# Patient Record
Sex: Female | Born: 1965
Health system: Southern US, Community
[De-identification: ages and names within clinical notes are randomized; demographics above are authoritative.]

## PROBLEM LIST (undated history)

## (undated) DIAGNOSIS — I10 Essential (primary) hypertension: Secondary | ICD-10-CM

## (undated) DIAGNOSIS — O1490 Unspecified pre-eclampsia, unspecified trimester: Secondary | ICD-10-CM

## (undated) DIAGNOSIS — E119 Type 2 diabetes mellitus without complications: Secondary | ICD-10-CM

## (undated) DIAGNOSIS — K219 Gastro-esophageal reflux disease without esophagitis: Secondary | ICD-10-CM

## (undated) DIAGNOSIS — K5900 Constipation, unspecified: Secondary | ICD-10-CM

## (undated) HISTORY — DX: Unspecified pre-eclampsia, unspecified trimester: O14.90

## (undated) HISTORY — DX: Type 2 diabetes mellitus without complications: E11.9

## (undated) SURGERY — Surgical Case
Anesthesia: *Unknown

---

## 2001-02-22 ENCOUNTER — Emergency Department (HOSPITAL_COMMUNITY): Admission: EM | Admit: 2001-02-22 | Discharge: 2001-02-22 | Payer: Self-pay | Admitting: Emergency Medicine

## 2002-10-20 ENCOUNTER — Inpatient Hospital Stay (HOSPITAL_COMMUNITY): Admission: AD | Admit: 2002-10-20 | Discharge: 2002-10-20 | Payer: Self-pay | Admitting: *Deleted

## 2003-09-22 ENCOUNTER — Ambulatory Visit (HOSPITAL_COMMUNITY): Admission: RE | Admit: 2003-09-22 | Discharge: 2003-09-22 | Payer: Self-pay | Admitting: Obstetrics

## 2003-09-22 ENCOUNTER — Encounter: Payer: Self-pay | Admitting: Obstetrics

## 2004-04-26 ENCOUNTER — Emergency Department (HOSPITAL_COMMUNITY): Admission: EM | Admit: 2004-04-26 | Discharge: 2004-04-26 | Payer: Self-pay | Admitting: Emergency Medicine

## 2009-07-26 ENCOUNTER — Emergency Department (HOSPITAL_COMMUNITY): Admission: EM | Admit: 2009-07-26 | Discharge: 2009-07-26 | Payer: Self-pay | Admitting: Emergency Medicine

## 2011-01-01 ENCOUNTER — Encounter: Payer: Self-pay | Admitting: Obstetrics

## 2011-03-17 ENCOUNTER — Emergency Department (HOSPITAL_COMMUNITY)
Admission: EM | Admit: 2011-03-17 | Discharge: 2011-03-17 | Disposition: A | Discharge status: Home / Self Care | Payer: BC Managed Care – PPO | Attending: Emergency Medicine | Admitting: Emergency Medicine

## 2011-03-17 DIAGNOSIS — R0602 Shortness of breath: Secondary | ICD-10-CM | POA: Insufficient documentation

## 2011-03-17 DIAGNOSIS — R0789 Other chest pain: Secondary | ICD-10-CM | POA: Insufficient documentation

## 2011-03-17 DIAGNOSIS — R002 Palpitations: Secondary | ICD-10-CM | POA: Insufficient documentation

## 2011-03-17 DIAGNOSIS — E860 Dehydration: Secondary | ICD-10-CM | POA: Insufficient documentation

## 2011-03-17 DIAGNOSIS — R000 Tachycardia, unspecified: Secondary | ICD-10-CM | POA: Insufficient documentation

## 2011-03-17 LAB — DIFFERENTIAL
Basophils Absolute: 0 10*3/uL (ref 0.0–0.1)
Basophils Relative: 0 % (ref 0–1)
Eosinophils Absolute: 0.1 10*3/uL (ref 0.0–0.7)
Eosinophils Relative: 2 % (ref 0–5)
Lymphocytes Relative: 42 % (ref 12–46)
Lymphs Abs: 3.5 10*3/uL (ref 0.7–4.0)
Monocytes Absolute: 0.5 10*3/uL (ref 0.1–1.0)
Monocytes Relative: 6 % (ref 3–12)
Neutro Abs: 4.2 10*3/uL (ref 1.7–7.7)
Neutrophils Relative %: 50 % (ref 43–77)

## 2011-03-17 LAB — CBC
HCT: 40.1 % (ref 36.0–46.0)
Hemoglobin: 14.2 g/dL (ref 12.0–15.0)
WBC: 8.3 10*3/uL (ref 4.0–10.5)

## 2011-03-17 LAB — POCT CARDIAC MARKERS
CKMB, poc: <1 ng/mL — ABNORMAL LOW (ref 1.0–8.0)
Troponin i, poc: <0.05 ng/mL (ref 0.00–0.09)

## 2011-03-17 LAB — POCT I-STAT, CHEM 8
Chloride: 110 mEq/L (ref 96–112)
Creatinine, Ser: 0.8 mg/dL (ref 0.4–1.2)
Glucose, Bld: 89 mg/dL (ref 70–99)
HCT: 44 % (ref 36.0–46.0)
Potassium: 4.1 mEq/L (ref 3.5–5.1)

## 2011-03-17 LAB — ETHANOL: Alcohol, Ethyl (B): 48 mg/dL — ABNORMAL HIGH (ref 0–10)

## 2011-03-17 LAB — RAPID URINE DRUG SCREEN, HOSP PERFORMED
Amphetamines: NOT DETECTED
Cocaine: NOT DETECTED
Tetrahydrocannabinol: NOT DETECTED

## 2011-03-17 LAB — URINALYSIS, ROUTINE W REFLEX MICROSCOPIC
Bilirubin Urine: NEGATIVE
Nitrite: NEGATIVE
Protein, ur: NEGATIVE mg/dL
Specific Gravity, Urine: 1.014 (ref 1.005–1.030)
Urobilinogen, UA: 0.2 mg/dL (ref 0.0–1.0)

## 2011-03-17 LAB — URINE MICROSCOPIC-ADD ON

## 2011-03-18 LAB — URINALYSIS, ROUTINE W REFLEX MICROSCOPIC
Nitrite: NEGATIVE
Protein, ur: 30 mg/dL — AB
Specific Gravity, Urine: 1.03 (ref 1.005–1.030)
Urobilinogen, UA: 1 mg/dL (ref 0.0–1.0)

## 2011-03-18 LAB — DIFFERENTIAL
Basophils Absolute: 0.1 10*3/uL (ref 0.0–0.1)
Basophils Relative: 1 % (ref 0–1)
Lymphocytes Relative: 19 % (ref 12–46)
Monocytes Absolute: 0.5 10*3/uL (ref 0.1–1.0)
Neutro Abs: 7.2 10*3/uL (ref 1.7–7.7)
Neutrophils Relative %: 74 % (ref 43–77)

## 2011-03-18 LAB — URINE MICROSCOPIC-ADD ON

## 2011-03-18 LAB — CBC
Hemoglobin: 14.9 g/dL (ref 12.0–15.0)
Platelets: 300 10*3/uL (ref 150–400)
RDW: 14.3 % (ref 11.5–15.5)

## 2011-03-18 LAB — PREGNANCY, URINE: Preg Test, Ur: NEGATIVE

## 2011-09-13 ENCOUNTER — Other Ambulatory Visit: Payer: Self-pay | Admitting: Obstetrics

## 2011-09-13 DIAGNOSIS — Z1231 Encounter for screening mammogram for malignant neoplasm of breast: Secondary | ICD-10-CM

## 2011-10-10 ENCOUNTER — Ambulatory Visit (HOSPITAL_COMMUNITY): Payer: BC Managed Care – PPO | Attending: Obstetrics

## 2014-12-16 ENCOUNTER — Emergency Department (INDEPENDENT_AMBULATORY_CARE_PROVIDER_SITE_OTHER)
Admission: EM | Admit: 2014-12-16 | Discharge: 2014-12-16 | Disposition: A | Discharge status: Home / Self Care | Payer: BLUE CROSS/BLUE SHIELD | Source: Home / Self Care | Attending: Family Medicine | Admitting: Family Medicine

## 2014-12-16 ENCOUNTER — Encounter (HOSPITAL_COMMUNITY): Payer: Self-pay | Admitting: Emergency Medicine

## 2014-12-16 DIAGNOSIS — N390 Urinary tract infection, site not specified: Secondary | ICD-10-CM

## 2014-12-16 LAB — POCT URINALYSIS DIP (DEVICE)
GLUCOSE, UA: NEGATIVE mg/dL
Ketones, ur: NEGATIVE mg/dL
Nitrite: NEGATIVE
PH: 5.5 (ref 5.0–8.0)
Protein, ur: 30 mg/dL — AB
SPECIFIC GRAVITY, URINE: 1.025 (ref 1.005–1.030)
Urobilinogen, UA: 0.2 mg/dL (ref 0.0–1.0)

## 2014-12-16 MED ORDER — CEPHALEXIN 500 MG PO CAPS: 500.0000 mg | ORAL_CAPSULE | Freq: Two times a day (BID) | ORAL | Status: DC

## 2014-12-16 MED ORDER — FLUCONAZOLE 150 MG PO TABS: 150.0000 mg | ORAL_TABLET | Freq: Once | ORAL | Status: DC

## 2014-12-16 NOTE — ED Notes (Signed)
C/o UTI sx onset Tuesday Sx include: dysuria Denies fevers, chills, abd pain, hematuria Alert, no signs of acute distress.

## 2014-12-16 NOTE — Discharge Instructions (Signed)
Thank you for coming in today. If your belly pain worsens, or you have high fever, bad vomiting, blood in your stool or black tarry stool go to the Emergency Room.   Urinary Tract Infection Urinary tract infections (UTIs) can develop anywhere along your urinary tract. Your urinary tract is your body's drainage system for removing wastes and extra water. Your urinary tract includes two kidneys, two ureters, a bladder, and a urethra. Your kidneys are a pair of bean-shaped organs. Each kidney is about the size of your fist. They are located below your ribs, one on each side of your spine. CAUSES Infections are caused by microbes, which are microscopic organisms, including fungi, viruses, and bacteria. These organisms are so small that they can only be seen through a microscope. Bacteria are the microbes that most commonly cause UTIs. SYMPTOMS  Symptoms of UTIs may vary by age and gender of the patient and by the location of the infection. Symptoms in young women typically include a frequent and intense urge to urinate and a painful, burning feeling in the bladder or urethra during urination. Older women and men are more likely to be tired, shaky, and weak and have muscle aches and abdominal pain. A fever may mean the infection is in your kidneys. Other symptoms of a kidney infection include pain in your back or sides below the ribs, nausea, and vomiting. DIAGNOSIS To diagnose a UTI, your caregiver will ask you about your symptoms. Your caregiver also will ask to provide a urine sample. The urine sample will be tested for bacteria and white blood cells. White blood cells are made by your body to help fight infection. TREATMENT  Typically, UTIs can be treated with medication. Because most UTIs are caused by a bacterial infection, they usually can be treated with the use of antibiotics. The choice of antibiotic and length of treatment depend on your symptoms and the type of bacteria causing your  infection. HOME CARE INSTRUCTIONS  If you were prescribed antibiotics, take them exactly as your caregiver instructs you. Finish the medication even if you feel better after you have only taken some of the medication.  Drink enough water and fluids to keep your urine clear or pale yellow.  Avoid caffeine, tea, and carbonated beverages. They tend to irritate your bladder.  Empty your bladder often. Avoid holding urine for long periods of time.  Empty your bladder before and after sexual intercourse.  After a bowel movement, women should cleanse from front to back. Use each tissue only once. SEEK MEDICAL CARE IF:   You have back pain.  You develop a fever.  Your symptoms do not begin to resolve within 3 days. SEEK IMMEDIATE MEDICAL CARE IF:   You have severe back pain or lower abdominal pain.  You develop chills.  You have nausea or vomiting.  You have continued burning or discomfort with urination. MAKE SURE YOU:   Understand these instructions.  Will watch your condition.  Will get help right away if you are not doing well or get worse. Document Released: 09/06/2005 Document Revised: 05/28/2012 Document Reviewed: 01/05/2012 Exit[employer] Patient Information 2015 [employer]. This information is not intended to replace advice given to you by your health care provider. Make sure you discuss any questions you have with your health care provider.  

## 2014-12-16 NOTE — ED Provider Notes (Signed)
Janice Singleton is a 49 y.o. female who presents to Urgent Care today for urinary tract infection. Patient has a few days of pain with burning associated with mild frequency and urgency. No fevers or chills or abdominal pain. No hematuria. No medications tried yet.   History reviewed. No pertinent past medical history. History reviewed. No pertinent past surgical history. History  Substance Use Topics  . Smoking status: Current Every Day Smoker    Types: Cigarettes  . Smokeless tobacco: Not on file  . Alcohol Use: Yes   ROS as above Medications: No current facility-administered medications for this encounter.   Current Outpatient Prescriptions  Medication Sig Dispense Refill  . cephALEXin (KEFLEX) 500 MG capsule Take 1 capsule (500 mg total) by mouth 2 (two) times daily. 14 capsule 0  . fluconazole (DIFLUCAN) 150 MG tablet Take 1 tablet (150 mg total) by mouth once. 1 tablet 1   No Known Allergies   Exam:  BP 150/93 mmHg  Pulse 93  Temp(Src) 98 F (36.7 C) (Oral)  Resp 16  SpO2 97% Gen: Well NAD HEENT: EOMI,  MMM Lungs: Normal work of breathing. CTABL Heart: RRR no MRG Abd: NABS, Soft. Nondistended, Nontender Exts: Brisk capillary refill, warm and well perfused.   Results for orders placed or performed during the hospital encounter of 12/16/14 (from the past 24 hour(s))  POCT urinalysis dip (device)     Status: Abnormal   Collection Time: 12/16/14  9:54 AM  Result Value Ref Range   Glucose, UA NEGATIVE NEGATIVE mg/dL   Bilirubin Urine SMALL (A) NEGATIVE   Ketones, ur NEGATIVE NEGATIVE mg/dL   Specific Gravity, Urine 1.025 1.005 - 1.030   Hgb urine dipstick MODERATE (A) NEGATIVE   pH 5.5 5.0 - 8.0   Protein, ur 30 (A) NEGATIVE mg/dL   Urobilinogen, UA 0.2 0.0 - 1.0 mg/dL   Nitrite NEGATIVE NEGATIVE   Leukocytes, UA SMALL (A) NEGATIVE   No results found.  Assessment and Plan: 49 y.o. female with UTI. Culture pending treatment with Keflex  Discussed warning signs  or symptoms. Please see discharge instructions. Patient expresses understanding.     Rodolph BongEvan S Ladrea Holladay, MD 12/16/14 1010

## 2014-12-18 LAB — URINE CULTURE
Colony Count: >=100000
SPECIAL REQUESTS: NORMAL

## 2014-12-18 NOTE — ED Notes (Signed)
Urine culture: >100,000 colonies E. Coli.  Pt. adequately treated with Keflex. Vassie MoselleYork, Murlin Schrieber M 12/18/2014

## 2016-02-29 ENCOUNTER — Encounter (HOSPITAL_COMMUNITY): Payer: Self-pay | Admitting: *Deleted

## 2016-02-29 ENCOUNTER — Inpatient Hospital Stay (HOSPITAL_COMMUNITY)
Admission: AD | Admit: 2016-02-29 | Discharge: 2016-02-29 | Disposition: A | Discharge status: Home / Self Care | Payer: BLUE CROSS/BLUE SHIELD | Source: Ambulatory Visit | Attending: Obstetrics & Gynecology | Admitting: Obstetrics & Gynecology

## 2016-02-29 DIAGNOSIS — I10 Essential (primary) hypertension: Secondary | ICD-10-CM | POA: Diagnosis not present

## 2016-02-29 DIAGNOSIS — B9689 Other specified bacterial agents as the cause of diseases classified elsewhere: Secondary | ICD-10-CM

## 2016-02-29 DIAGNOSIS — F17210 Nicotine dependence, cigarettes, uncomplicated: Secondary | ICD-10-CM | POA: Insufficient documentation

## 2016-02-29 DIAGNOSIS — N760 Acute vaginitis: Secondary | ICD-10-CM | POA: Insufficient documentation

## 2016-02-29 DIAGNOSIS — A499 Bacterial infection, unspecified: Secondary | ICD-10-CM | POA: Diagnosis not present

## 2016-02-29 DIAGNOSIS — K5909 Other constipation: Secondary | ICD-10-CM | POA: Diagnosis not present

## 2016-02-29 DIAGNOSIS — K5900 Constipation, unspecified: Secondary | ICD-10-CM | POA: Diagnosis not present

## 2016-02-29 DIAGNOSIS — R109 Unspecified abdominal pain: Secondary | ICD-10-CM | POA: Insufficient documentation

## 2016-02-29 HISTORY — DX: Constipation, unspecified: K59.00

## 2016-02-29 HISTORY — DX: Gastro-esophageal reflux disease without esophagitis: K21.9

## 2016-02-29 LAB — URINALYSIS, ROUTINE W REFLEX MICROSCOPIC
Bilirubin Urine: NEGATIVE
GLUCOSE, UA: NEGATIVE mg/dL
Ketones, ur: NEGATIVE mg/dL
LEUKOCYTES UA: NEGATIVE
Nitrite: NEGATIVE
PH: 5.5 (ref 5.0–8.0)
PROTEIN: NEGATIVE mg/dL
SPECIFIC GRAVITY, URINE: 1.015 (ref 1.005–1.030)

## 2016-02-29 LAB — CBC
HEMATOCRIT: 40 % (ref 36.0–46.0)
Hemoglobin: 13.8 g/dL (ref 12.0–15.0)
MCH: 33.8 pg (ref 26.0–34.0)
MCHC: 34.5 g/dL (ref 30.0–36.0)
MCV: 98 fL (ref 78.0–100.0)
Platelets: 266 10*3/uL (ref 150–400)
RBC: 4.08 MIL/uL (ref 3.87–5.11)
RDW: 14.7 % (ref 11.5–15.5)
WBC: 9.3 10*3/uL (ref 4.0–10.5)

## 2016-02-29 LAB — WET PREP, GENITAL
Sperm: NONE SEEN
TRICH WET PREP: NONE SEEN
YEAST WET PREP: NONE SEEN

## 2016-02-29 LAB — URINE MICROSCOPIC-ADD ON
BACTERIA UA: NONE SEEN
WBC UA: NONE SEEN WBC/hpf (ref 0–5)

## 2016-02-29 LAB — POCT PREGNANCY, URINE: Preg Test, Ur: NEGATIVE

## 2016-02-29 MED ORDER — METRONIDAZOLE 0.75 % VA GEL: 1.0000 | Freq: Every day | VAGINAL | Status: DC

## 2016-02-29 MED ORDER — AMLODIPINE BESYLATE 5 MG PO TABS: 5.0000 mg | ORAL_TABLET | Freq: Every day | ORAL | Status: DC

## 2016-02-29 NOTE — Discharge Instructions (Signed)
Bacterial Vaginosis °Bacterial vaginosis is a vaginal infection that occurs when the normal balance of bacteria in the vagina is disrupted. It results from an overgrowth of certain bacteria. This is the most common vaginal infection in women of childbearing age. Treatment is important to prevent complications, especially in pregnant women, as it can cause a premature delivery. °CAUSES  °Bacterial vaginosis is caused by an increase in harmful bacteria that are normally present in smaller amounts in the vagina. Several different kinds of bacteria can cause bacterial vaginosis. However, the reason that the condition develops is not fully understood. °RISK FACTORS °Certain activities or behaviors can put you at an increased risk of developing bacterial vaginosis, including: °· Having a new sex partner or multiple sex partners. °· Douching. °· Using an intrauterine device (IUD) for contraception. °Women do not get bacterial vaginosis from toilet seats, bedding, swimming pools, or contact with objects around them. °SIGNS AND SYMPTOMS  °Some women with bacterial vaginosis have no signs or symptoms. Common symptoms include: °· Grey vaginal discharge. °· A fishlike odor with discharge, especially after sexual intercourse. °· Itching or burning of the vagina and vulva. °· Burning or pain with urination. °DIAGNOSIS  °Your health care provider will take a medical history and examine the vagina for signs of bacterial vaginosis. A sample of vaginal fluid may be taken. Your health care provider will look at this sample under a microscope to check for bacteria and abnormal cells. A vaginal pH test may also be done.  °TREATMENT  °Bacterial vaginosis may be treated with antibiotic medicines. These may be given in the form of a pill or a vaginal cream. A second round of antibiotics may be prescribed if the condition comes back after treatment. Because bacterial vaginosis increases your risk for sexually transmitted diseases, getting  treated can help reduce your risk for chlamydia, gonorrhea, HIV, and herpes. °HOME CARE INSTRUCTIONS  °· Only take over-the-counter or prescription medicines as directed by your health care provider. °· If antibiotic medicine was prescribed, take it as directed. Make sure you finish it even if you start to feel better. °· Tell all sexual partners that you have a vaginal infection. They should see their health care provider and be treated if they have problems, such as a mild rash or itching. °· During treatment, it is important that you follow these instructions: °· Avoid sexual activity or use condoms correctly. °· Do not douche. °· Avoid alcohol as directed by your health care provider. °· Avoid breastfeeding as directed by your health care provider. °SEEK MEDICAL CARE IF:  °· Your symptoms are not improving after 3 days of treatment. °· You have increased discharge or pain. °· You have a fever. °MAKE SURE YOU:  °· Understand these instructions. °· Will watch your condition. °· Will get help right away if you are not doing well or get worse. °FOR MORE INFORMATION  °Centers for Disease Control and Prevention, Division of STD Prevention: www.cdc.gov/std °American Sexual Health Association (ASHA): www.ashastd.org  °  °This information is not intended to replace advice given to you by your health care provider. Make sure you discuss any questions you have with your health care provider. °  °Document Released: 11/27/2005 Document Revised: 12/18/2014 Document Reviewed: 07/09/2013 °Elsevier Interactive Patient Education ©2016 [employer] °Hypertension °Hypertension, commonly called high blood pressure, is when the force of blood pumping through your arteries is too strong. Your arteries are the blood vessels that carry blood from your heart throughout your body. A blood   pressure reading consists of a higher number over a lower number, such as 110/72. The higher number (systolic) is the pressure inside your arteries  when your heart pumps. The lower number (diastolic) is the pressure inside your arteries when your heart relaxes. Ideally you want your blood pressure below 120/80. °Hypertension forces your heart to work harder to pump blood. Your arteries may become narrow or stiff. Having untreated or uncontrolled hypertension can cause heart attack, stroke, kidney disease, and other problems. °RISK FACTORS °Some risk factors for high blood pressure are controllable. Others are not.  °Risk factors you cannot control include:  °· Race. You may be at higher risk if you are African American. °· Age. Risk increases with age. °· Gender. Men are at higher risk than women before age 45 years. After age 65, women are at higher risk than men. °Risk factors you can control include: °· Not getting enough exercise or physical activity. °· Being overweight. °· Getting too much fat, sugar, calories, or salt in your diet. °· Drinking too much alcohol. °SIGNS AND SYMPTOMS °Hypertension does not usually cause signs or symptoms. Extremely high blood pressure (hypertensive crisis) may cause headache, anxiety, shortness of breath, and nosebleed. °DIAGNOSIS °To check if you have hypertension, your health care provider will measure your blood pressure while you are seated, with your arm held at the level of your heart. It should be measured at least twice using the same arm. Certain conditions can cause a difference in blood pressure between your right and left arms. A blood pressure reading that is higher than normal on one occasion does not mean that you need treatment. If it is not clear whether you have high blood pressure, you may be asked to return on a different day to have your blood pressure checked again. Or, you may be asked to monitor your blood pressure at home for 1 or more weeks. °TREATMENT °Treating high blood pressure includes making lifestyle changes and possibly taking medicine. Living a healthy lifestyle can help lower high blood  pressure. You may need to change some of your habits. °Lifestyle changes may include: °· Following the DASH diet. This diet is high in fruits, vegetables, and whole grains. It is low in salt, red meat, and added sugars. °· Keep your sodium intake below 2,300 mg per day. °· Getting at least 30-45 minutes of aerobic exercise at least 4 times per week. °· Losing weight if necessary. °· Not smoking. °· Limiting alcoholic beverages. °· Learning ways to reduce stress. °Your health care provider may prescribe medicine if lifestyle changes are not enough to get your blood pressure under control, and if one of the following is true: °· You are 18-59 years of age and your systolic blood pressure is above 140. °· You are 60 years of age or older, and your systolic blood pressure is above 150. °· Your diastolic blood pressure is above 90. °· You have diabetes, and your systolic blood pressure is over 140 or your diastolic blood pressure is over 90. °· You have kidney disease and your blood pressure is above 140/90. °· You have heart disease and your blood pressure is above 140/90. °Your personal target blood pressure may vary depending on your medical conditions, your age, and other factors. °HOME CARE INSTRUCTIONS °· Have your blood pressure rechecked as directed by your health care provider.   °· Take medicines only as directed by your health care provider. Follow the directions carefully. Blood pressure medicines must be taken as   prescribed. The medicine does not work as well when you skip doses. Skipping doses also puts you at risk for problems. °· Do not smoke.   °· Monitor your blood pressure at home as directed by your health care provider.  °SEEK MEDICAL CARE IF:  °· You think you are having a reaction to medicines taken. °· You have recurrent headaches or feel dizzy. °· You have swelling in your ankles. °· You have trouble with your vision. °SEEK IMMEDIATE MEDICAL CARE IF: °· You develop a severe headache or  confusion. °· You have unusual weakness, numbness, or feel faint. °· You have severe chest or abdominal pain. °· You vomit repeatedly. °· You have trouble breathing. °MAKE SURE YOU:  °· Understand these instructions. °· Will watch your condition. °· Will get help right away if you are not doing well or get worse. °  °This information is not intended to replace advice given to you by your health care provider. Make sure you discuss any questions you have with your health care provider. °  °Document Released: 11/27/2005 Document Revised: 04/13/2015 Document Reviewed: 09/19/2013 °Elsevier Interactive Patient Education ©2016 Elsevier Inc. ° °

## 2016-02-29 NOTE — MAU Note (Signed)
Pt C/O lower abd pain for the past 2 days, hurts to walk.  Has constipation, but had BM yesterday & today.  Denies bleeding or discharge.

## 2016-02-29 NOTE — MAU Provider Note (Signed)
History     CSN: [id number]  Arrival date and time: 02/29/16 [id number]   First Provider Initiated Contact with Patient 02/29/16 1103      Chief Complaint  Patient presents with  . Abdominal Pain   HPI   Ms.Theta Nicholaus BloomKelley is a 50 y.o. female G1P1001 presenting to MAU with abdominal pain. The pain started on Saturday; the pain is located in the bottom of her stomach on both sides. Initially she though it was due to constipation,however she had two bowel movements in the last 2 days. One was hard, and the second one was loose. She does not feel constipation any more.   Patient attests to drinking alcohol; not daily "I drink beer on the weekends".   She is sexually active with one partner X one year. No abnormal discharge.   Patient denies history of hypertension, she was told one time in the office that her BP was high.    OB History    Gravida Para Term Preterm AB TAB SAB Ectopic Multiple Living   1 1 1       1       Past Medical History  Diagnosis Date  . Acid reflux   . Constipation     Past Surgical History  Procedure Laterality Date  . Cesarean section      History reviewed. No pertinent family history.  Social History  Substance Use Topics  . Smoking status: Current Every Day Smoker -- 1.00 packs/day    Types: Cigarettes  . Smokeless tobacco: Never Used  . Alcohol Use: Yes     Comment: Socially    Allergies: No Known Allergies  Prescriptions prior to admission  Medication Sig Dispense Refill Last Dose  . cephALEXin (KEFLEX) 500 MG capsule Take 1 capsule (500 mg total) by mouth 2 (two) times daily. (Patient not taking: Reported on 02/29/2016) 14 capsule 0   . fluconazole (DIFLUCAN) 150 MG tablet Take 1 tablet (150 mg total) by mouth once. (Patient not taking: Reported on 02/29/2016) 1 tablet 1    Results for orders placed or performed during the hospital encounter of 02/29/16 (from the past 48 hour(s))  Urinalysis, Routine w reflex microscopic ([facility])      Status: Abnormal   Collection Time: 02/29/16  9:51 AM  Result Value Ref Range   Color, Urine YELLOW YELLOW   APPearance CLEAR CLEAR   Specific Gravity, Urine 1.015 1.005 - 1.030   pH 5.5 5.0 - 8.0   Glucose, UA NEGATIVE NEGATIVE mg/dL   Hgb urine dipstick SMALL (A) NEGATIVE   Bilirubin Urine NEGATIVE NEGATIVE   Ketones, ur NEGATIVE NEGATIVE mg/dL   Protein, ur NEGATIVE NEGATIVE mg/dL   Nitrite NEGATIVE NEGATIVE   Leukocytes, UA NEGATIVE NEGATIVE  Urine microscopic-add on     Status: Abnormal   Collection Time: 02/29/16  9:51 AM  Result Value Ref Range   Squamous Epithelial / LPF 0-5 (A) NONE SEEN   WBC, UA NONE SEEN 0 - 5 WBC/hpf   RBC / HPF 0-5 0 - 5 RBC/hpf   Bacteria, UA NONE SEEN NONE SEEN  Pregnancy, urine POC     Status: None   Collection Time: 02/29/16 10:13 AM  Result Value Ref Range   Preg Test, Ur NEGATIVE NEGATIVE    Comment:        THE SENSITIVITY OF THIS METHODOLOGY IS >24 mIU/mL   Wet prep, genital     Status: Abnormal   Collection Time: 02/29/16 11:29 AM  Result Value  Ref Range   Yeast Wet Prep HPF POC NONE SEEN NONE SEEN   Trich, Wet Prep NONE SEEN NONE SEEN   Clue Cells Wet Prep HPF POC PRESENT (A) NONE SEEN   WBC, Wet Prep HPF POC FEW (A) NONE SEEN    Comment: MODERATE BACTERIA SEEN   Sperm NONE SEEN   CBC     Status: None   Collection Time: 02/29/16 11:38 AM  Result Value Ref Range   WBC 9.3 4.0 - 10.5 K/uL   RBC 4.08 3.87 - 5.11 MIL/uL   Hemoglobin 13.8 12.0 - 15.0 g/dL   HCT 29.5 62.1 - 30.8 %   MCV 98.0 78.0 - 100.0 fL   MCH 33.8 26.0 - 34.0 pg   MCHC 34.5 30.0 - 36.0 g/dL   RDW 65.7 84.6 - 96.2 %   Platelets 266 150 - 400 K/uL    Review of Systems  Constitutional: Negative for fever and chills.  Gastrointestinal: Positive for abdominal pain and constipation. Negative for nausea, vomiting and diarrhea.  Genitourinary: Negative for dysuria.   Physical Exam   Blood pressure 155/85, pulse 88, temperature 98 F (36.7 C), temperature source  Oral, resp. rate 18, last menstrual period 01/26/2016.  Physical Exam  Constitutional: She is oriented to person, place, and time. She appears well-developed and well-nourished. No distress.  HENT:  Head: Normocephalic.  Eyes: Pupils are equal, round, and reactive to light.  Cardiovascular: Normal rate and normal heart sounds.   Respiratory: Effort normal.  GI: Soft. She exhibits no distension and no mass. There is no tenderness. There is no rebound and no guarding.  Genitourinary:  Speculum exam: Vagina - Small amount of thick, white discharge, mild odor Cervix - No contact bleeding Bimanual exam: Cervix closed, no CMT  Uterus non tender, normal size Adnexa non tender, no masses bilaterally GC/Chlam, wet prep done Chaperone present for exam.  Musculoskeletal: Normal range of motion.  Neurological: She is alert and oriented to person, place, and time.  Skin: Skin is warm. She is not diaphoretic.  Psychiatric: Her behavior is normal.    MAU Course  Procedures  None  MDM   Assessment and Plan   A:  1. Other constipation   2. BV (bacterial vaginosis)   3. Chronic hypertension     P:  Discharge home in stable condition RX: Norvasc 5 mg, metrogel Abdominal pain likely due to chronic constipation vs. BV Discussed at home remedies/ over the counter medications for constipation Patient to go to urgent care or [facility] if symptoms worsen.  Discussed the importance of PCP; patient given a list of local providers.     Duane Lope, NP 02/29/2016 2:29 PM

## 2016-03-01 LAB — GC/CHLAMYDIA PROBE AMP (~~LOC~~) NOT AT ARMC
Chlamydia: NEGATIVE
NEISSERIA GONORRHEA: NEGATIVE

## 2016-03-01 LAB — HIV ANTIBODY (ROUTINE TESTING W REFLEX): HIV SCREEN 4TH GENERATION: NONREACTIVE

## 2017-01-27 ENCOUNTER — Encounter (HOSPITAL_COMMUNITY): Payer: Self-pay | Admitting: Emergency Medicine

## 2017-01-27 ENCOUNTER — Emergency Department (HOSPITAL_COMMUNITY)
Admission: EM | Admit: 2017-01-27 | Discharge: 2017-01-27 | Disposition: A | Discharge status: Home / Self Care | Payer: BLUE CROSS/BLUE SHIELD | Attending: Emergency Medicine | Admitting: Emergency Medicine

## 2017-01-27 DIAGNOSIS — Z760 Encounter for issue of repeat prescription: Secondary | ICD-10-CM | POA: Insufficient documentation

## 2017-01-27 DIAGNOSIS — F17210 Nicotine dependence, cigarettes, uncomplicated: Secondary | ICD-10-CM | POA: Insufficient documentation

## 2017-01-27 DIAGNOSIS — Z79899 Other long term (current) drug therapy: Secondary | ICD-10-CM | POA: Insufficient documentation

## 2017-01-27 DIAGNOSIS — I10 Essential (primary) hypertension: Secondary | ICD-10-CM | POA: Insufficient documentation

## 2017-01-27 HISTORY — DX: Essential (primary) hypertension: I10

## 2017-01-27 LAB — I-STAT CHEM 8, ED
BUN: 6 mg/dL (ref 6–20)
CALCIUM ION: 1.28 mmol/L (ref 1.15–1.40)
CREATININE: 0.6 mg/dL (ref 0.44–1.00)
Chloride: 107 mmol/L (ref 101–111)
GLUCOSE: 73 mg/dL (ref 65–99)
HCT: 44 % (ref 36.0–46.0)
HEMOGLOBIN: 15 g/dL (ref 12.0–15.0)
Potassium: 4.2 mmol/L (ref 3.5–5.1)
Sodium: 143 mmol/L (ref 135–145)
TCO2: 27 mmol/L (ref 0–100)

## 2017-01-27 MED ORDER — AMLODIPINE BESYLATE 5 MG PO TABS: 5.0000 mg | ORAL_TABLET | Freq: Every day | ORAL | 0 refills | Status: DC

## 2017-01-27 MED ORDER — AMLODIPINE BESYLATE 5 MG PO TABS
5.0000 mg | ORAL_TABLET | Freq: Once | ORAL | Status: AC
  Administered 2017-01-27: 5 mg via ORAL
  Filled 2017-01-27: qty 1

## 2017-01-27 NOTE — ED Provider Notes (Signed)
[facility]-EMERGENCY DEPT Provider Note   CSN: [id number] Arrival date & time: 01/27/17  1022  By signing my name below, I, Soijett Blue, attest that this documentation has been prepared under the direction and in the presence of Trixie DredgeEmily Jadrien Narine, PA-C Electronically Signed: Soijett Blue, ED Scribe. 01/27/17. 11:07 AM.  History   Chief Complaint Chief Complaint  Patient presents with  . Hypertension  . Medication Refill    HPI Janice Singleton is a 51 y.o. female with a PMHx of HTN, who presents to the Emergency Department complaining of elevated blood pressure that she noticed 2-3 days ago. Pt drank vinegar without improvement.   Pt has had intermittent tingling in her left arm lasting seconds, that comes and goes lasting seconds.  She has not had this today, feels like her typical arthritis.  Pt notes that she hasn't taken her HTN medications for the past 6 months and reports that she used to take norvasc 5 mg for her HTN. Pt denies CP, SOB, HA, vision change, fever, chills, and any other symptoms. Pt denies having a PCP.   The history is provided by the patient. No language interpreter was used.    Past Medical History:  Diagnosis Date  . Acid reflux   . Constipation   . Hypertension     There are no active problems to display for this patient.   Past Surgical History:  Procedure Laterality Date  . CESAREAN SECTION      OB History    Gravida Para Term Preterm AB Living   1 1 1     1    SAB TAB Ectopic Multiple Live Births           1       Home Medications    Prior to Admission medications   Medication Sig Start Date End Date Taking? Authorizing Provider  amLODipine (NORVASC) 5 MG tablet Take 1 tablet (5 mg total) by mouth daily. 02/29/16   Harolyn RutherfordJennifer I Rasch, NP  amLODipine (NORVASC) 5 MG tablet Take 1 tablet (5 mg total) by mouth daily. 01/27/17   Trixie DredgeEmily Traci Gafford, PA-C  metroNIDAZOLE (METROGEL VAGINAL) 0.75 % vaginal gel Place 1 Applicatorful vaginally at bedtime. 02/29/16   Duane LopeJennifer I  Rasch, NP    Family History No family history on file.  Social History Social History  Substance Use Topics  . Smoking status: Current Every Day Smoker    Packs/day: 1.00    Types: Cigarettes  . Smokeless tobacco: Never Used  . Alcohol use Yes     Comment: Socially     Allergies   Patient has no known allergies.   Review of Systems Review of Systems  Constitutional: Negative for chills and fever.  Eyes: Negative for visual disturbance.  Respiratory: Negative for shortness of breath.   Cardiovascular: Negative for chest pain.  Neurological: Negative for headaches.       +Tingling to left arm     Physical Exam Updated Vital Signs BP 177/100 (BP Location: Left Arm)   Pulse 80   Temp 98.5 F (36.9 C) (Oral)   Resp 18   SpO2 97%   Physical Exam  Constitutional: She appears well-developed and well-nourished. No distress.  HENT:  Head: Normocephalic and atraumatic.  Neck: Neck supple.  Cardiovascular: Normal rate, regular rhythm and normal heart sounds.  Exam reveals no gallop and no friction rub.   No murmur heard. Pulmonary/Chest: Effort normal and breath sounds normal. No respiratory distress. She has no wheezes. She has no rales.  Neurological: She is alert.  Skin: She is not diaphoretic.  Nursing note and vitals reviewed.    ED Treatments / Results  DIAGNOSTIC STUDIES: Oxygen Saturation is 98% on RA, nl by my interpretation.    COORDINATION OF CARE: 11:04 AM Discussed treatment plan with pt at bedside which includes labs, and pt agreed to plan.   Labs (all labs ordered are listed, but only abnormal results are displayed) Labs Reviewed  I-STAT CHEM 8, ED    Procedures Procedures (including critical care time)  Medications Ordered in ED Medications  amLODipine (NORVASC) tablet 5 mg (5 mg Oral Given 01/27/17 1132)     Initial Impression / Assessment and Plan / ED Course  I have reviewed the triage vital signs and the nursing  notes.  Pertinent labs that were available during my care of the patient were reviewed by me and considered in my medical decision making (see chart for details).     Patient noted to be hypertensive in the emergency department. No signs of hypertensive urgency. Pt here for refill of HTN medication. Medication is not a controlled substance. Will refill medication here. Discussed with patient the need for close follow-up and management by their primary care physician. Pt is safe for discharge at this time. Discussed result, findings, treatment, and follow up  with patient.  Pt given return precautions.  Pt verbalizes understanding and agrees with plan.      Final Clinical Impressions(s) / ED Diagnoses   Final diagnoses:  Medication refill  Hypertension, unspecified type    New Prescriptions Discharge Medication List as of 01/27/2017 12:10 PM    START taking these medications   Details  !! amLODipine (NORVASC) 5 MG tablet Take 1 tablet (5 mg total) by mouth daily., Starting Sat 01/27/2017, Print     !! - Potential duplicate medications found. Please discuss with provider.     I personally performed the services described in this documentation, which was scribed in my presence. The recorded information has been reviewed and is accurate.     Trixie Dredge, PA-C 01/27/17 1455    Gwyneth Sprout, MD 01/27/17 1626

## 2017-01-27 NOTE — Discharge Instructions (Signed)
Read the information below.  Use the prescribed medication as directed.  Please discuss all new medications with your pharmacist.  You may return to the Emergency Department at any time for worsening condition or any new symptoms that concern you.  Please check you blood pressure within 1 week.  Follow up with your primary care provider for continued management of you blood pressure.

## 2017-01-27 NOTE — ED Triage Notes (Signed)
Per pt, states she has not taking her Norvasc in over a year-states her BP has been high-asymptomatic

## 2018-02-06 ENCOUNTER — Emergency Department (HOSPITAL_COMMUNITY): Payer: BLUE CROSS/BLUE SHIELD

## 2018-02-06 ENCOUNTER — Emergency Department (HOSPITAL_COMMUNITY)
Admission: EM | Admit: 2018-02-06 | Discharge: 2018-02-06 | Disposition: A | Discharge status: Home / Self Care | Payer: BLUE CROSS/BLUE SHIELD | Attending: Emergency Medicine | Admitting: Emergency Medicine

## 2018-02-06 ENCOUNTER — Encounter (HOSPITAL_COMMUNITY): Payer: Self-pay | Admitting: Emergency Medicine

## 2018-02-06 DIAGNOSIS — Z79899 Other long term (current) drug therapy: Secondary | ICD-10-CM | POA: Insufficient documentation

## 2018-02-06 DIAGNOSIS — M79641 Pain in right hand: Secondary | ICD-10-CM | POA: Diagnosis not present

## 2018-02-06 DIAGNOSIS — R102 Pelvic and perineal pain: Secondary | ICD-10-CM

## 2018-02-06 DIAGNOSIS — R52 Pain, unspecified: Secondary | ICD-10-CM

## 2018-02-06 DIAGNOSIS — K769 Liver disease, unspecified: Secondary | ICD-10-CM | POA: Diagnosis not present

## 2018-02-06 DIAGNOSIS — F17210 Nicotine dependence, cigarettes, uncomplicated: Secondary | ICD-10-CM | POA: Insufficient documentation

## 2018-02-06 DIAGNOSIS — N858 Other specified noninflammatory disorders of uterus: Secondary | ICD-10-CM | POA: Diagnosis not present

## 2018-02-06 DIAGNOSIS — I10 Essential (primary) hypertension: Secondary | ICD-10-CM | POA: Diagnosis not present

## 2018-02-06 DIAGNOSIS — R350 Frequency of micturition: Secondary | ICD-10-CM | POA: Diagnosis not present

## 2018-02-06 LAB — CBC WITH DIFFERENTIAL/PLATELET
Basophils Absolute: 0 10*3/uL (ref 0.0–0.1)
Basophils Relative: 0 %
Eosinophils Absolute: 0.2 10*3/uL (ref 0.0–0.7)
Eosinophils Relative: 2 %
HCT: 41.1 % (ref 36.0–46.0)
Hemoglobin: 14.1 g/dL (ref 12.0–15.0)
Lymphocytes Relative: 34 %
Lymphs Abs: 3 10*3/uL (ref 0.7–4.0)
MCH: 33.3 pg (ref 26.0–34.0)
MCHC: 34.3 g/dL (ref 30.0–36.0)
MCV: 96.9 fL (ref 78.0–100.0)
Monocytes Absolute: 0.3 10*3/uL (ref 0.1–1.0)
Monocytes Relative: 4 %
Neutro Abs: 5.3 10*3/uL (ref 1.7–7.7)
Neutrophils Relative %: 60 %
Platelets: 245 10*3/uL (ref 150–400)
RBC: 4.24 MIL/uL (ref 3.87–5.11)
RDW: 15.2 % (ref 11.5–15.5)
WBC: 8.8 10*3/uL (ref 4.0–10.5)

## 2018-02-06 LAB — URINALYSIS, ROUTINE W REFLEX MICROSCOPIC
Bilirubin Urine: NEGATIVE
Glucose, UA: NEGATIVE mg/dL
Hgb urine dipstick: NEGATIVE
Ketones, ur: NEGATIVE mg/dL
Leukocytes, UA: NEGATIVE
Nitrite: NEGATIVE
Protein, ur: NEGATIVE mg/dL
Specific Gravity, Urine: 1.009 (ref 1.005–1.030)
pH: 7 (ref 5.0–8.0)

## 2018-02-06 LAB — WET PREP, GENITAL
Sperm: NONE SEEN
TRICH WET PREP: NONE SEEN
Yeast Wet Prep HPF POC: NONE SEEN

## 2018-02-06 LAB — BASIC METABOLIC PANEL
Anion gap: 10 (ref 5–15)
BUN: 7 mg/dL (ref 6–20)
CO2: 25 mmol/L (ref 22–32)
Calcium: 9 mg/dL (ref 8.9–10.3)
Chloride: 106 mmol/L (ref 101–111)
Creatinine, Ser: 0.58 mg/dL (ref 0.44–1.00)
GFR calc Af Amer: >60 mL/min (ref >60)
GFR calc non Af Amer: >60 mL/min (ref >60)
Glucose, Bld: 165 mg/dL — ABNORMAL HIGH (ref 65–99)
Potassium: 3.5 mmol/L (ref 3.5–5.1)
Sodium: 141 mmol/L (ref 135–145)

## 2018-02-06 LAB — POC URINE PREG, ED: Preg Test, Ur: NEGATIVE

## 2018-02-06 MED ORDER — KETOROLAC TROMETHAMINE 60 MG/2ML IM SOLN
30.0000 mg | Freq: Once | INTRAMUSCULAR | Status: DC
  Filled 2018-02-06: qty 2

## 2018-02-06 MED ORDER — ACETAMINOPHEN 500 MG PO TABS: 500.0000 mg | ORAL_TABLET | Freq: Four times a day (QID) | ORAL | 0 refills | Status: DC | PRN

## 2018-02-06 MED ORDER — KETOROLAC TROMETHAMINE 30 MG/ML IJ SOLN
30.0000 mg | Freq: Once | INTRAMUSCULAR | Status: AC
  Administered 2018-02-06: 30 mg via INTRAVENOUS

## 2018-02-06 MED ORDER — DICYCLOMINE HCL 10 MG/ML IM SOLN
20.0000 mg | Freq: Once | INTRAMUSCULAR | Status: AC
  Administered 2018-02-06: 20 mg via INTRAMUSCULAR
  Filled 2018-02-06: qty 2

## 2018-02-06 MED ORDER — AMLODIPINE BESYLATE 5 MG PO TABS: 5.0000 mg | ORAL_TABLET | Freq: Every day | ORAL | 0 refills | Status: DC

## 2018-02-06 MED ORDER — IOPAMIDOL (ISOVUE-300) INJECTION 61%
INTRAVENOUS | Status: AC
  Administered 2018-02-06: 80 mL
  Filled 2018-02-06: qty 100

## 2018-02-06 MED ORDER — IBUPROFEN 800 MG PO TABS: 800.0000 mg | ORAL_TABLET | Freq: Three times a day (TID) | ORAL | 0 refills | Status: DC

## 2018-02-06 NOTE — ED Provider Notes (Signed)
Signout from [employer] Edison[employer]ole Pisiciotta, PA-C at shift change See previous provider note for full H&P  Briefly, patient has had lower abdominal pain radiating to her back for the past 4 days.  She reports it alternates from right to left side.  Labs are unremarkable. Pelvic exam was conducted and found no significant findings. At time of signout, CT abdomen pelvis is pending.    CT abdomen pelvis shows: IMPRESSION: 1. There is a mass arising from the leftward aspect of the uterine fundus measuring 6.7 x 4.0 x 3.9 cm. This mass shows irregular enhancement. While this mass potentially may represent a leiomyoma. This enhancement pattern does raise concern for possible uterine neoplasm. Note that there is also fullness in the region of the cervix. A well-defined cervical mass is not seen by CT. These findings do warrant gynecologic consultation including visual assessment of the cervix.  2. Enhancing lesion in the right lobe of the liver. Question slightly atypical hemangioma. A liver metastasis could present in this manner. It may be prudent in this regard to consider nonemergent MR or CT pre and serial post-contrast of the liver for further assessment.  3. Urinary bladder wall thickening felt to represent a degree of cystitis. No renal or ureteral calculus. No hydronephrosis.  4. No bowel obstruction. No abscess. Appendix appears normal.  5. No demonstrable adenopathy. No evident omental lesions.  6. Sclerotic area in the L3 vertebral body. This sclerotic focus may represent a prominent bone island. A sclerotic neoplastic lesion cannot be excluded, however.  7. Aortoiliac atherosclerosis.  I consulted OB/GYN who advised close follow-up.  Dr. Alvester MorinNewton, OB/GYN, states that their office will contact the patient, the patient also given follow-up information.  Patient also advised to follow-up and establish care with PCP for further evaluation of liver lesion.  Patient given several  resources.  Will discharge home with ibuprofen, Tylenol, and also refill of Norvasc.  Patient is well-appearing with vitals stable and discharged in satisfactory condition.    Emi HolesLaw, Janice Porto M, PA-C 02/06/18 1648    Tilden Fossaees, Elizabeth, MD 02/07/18 [phone]

## 2018-02-06 NOTE — ED Provider Notes (Signed)
[facility] Provider Note   CSN: [id number] Arrival date & time: 02/06/18  1043     History   Chief Complaint Chief Complaint  Patient presents with  . Pelvic Pain     HPI   Blood pressure (!) 145/89, pulse 80, temperature 98.2 F (36.8 C), temperature source Oral, resp. rate 17, last menstrual period 01/26/2016, SpO2 98 %.  Janice Singleton is a 52 y.o. female complaining of lower abdominal pain radiating to the back onset 4 days ago with no associated fevers, chills, nausea, vomiting, dysuria, hematuria or abnormal vaginal discharge.  She is been taking ibuprofen at home with little relief, she also took laxatives with little relief.  She notes a pain to her right hand (triage note states that the swelling but this is not accurate) and bilateral plantar foot pain when she walks.  Is been noncompliant with her hypertensive medications for several months.  Past Medical History:  Diagnosis Date  . Acid reflux   . Constipation   . Hypertension     There are no active problems to display for this patient.   Past Surgical History:  Procedure Laterality Date  . CESAREAN SECTION      OB History    Gravida Para Term Preterm AB Living   1 1 1     1    SAB TAB Ectopic Multiple Live Births           1       Home Medications    Prior to Admission medications   Medication Sig Start Date End Date Taking? Authorizing Provider  amLODipine (NORVASC) 5 MG tablet Take 1 tablet (5 mg total) by mouth daily. 02/29/16   Rasch, Victorino DikeJennifer I, NP  amLODipine (NORVASC) 5 MG tablet Take 1 tablet (5 mg total) by mouth daily. 01/27/17   Trixie DredgeWest, Emily, PA-C  amLODipine (NORVASC) 5 MG tablet Take 1 tablet (5 mg total) by mouth daily. 02/06/18   Saunders [place], Joni ReiningNicole, PA-C  metroNIDAZOLE (METROGEL VAGINAL) 0.75 % vaginal gel Place 1 Applicatorful vaginally at bedtime. 02/29/16   Rasch, Harolyn RutherfordJennifer I, NP    Family History History reviewed. No pertinent family  history.  Social History Social History   Tobacco Use  . Smoking status: Current Every Day Smoker    Packs/day: 1.00    Types: Cigarettes  . Smokeless tobacco: Never Used  Substance Use Topics  . Alcohol use: Yes    Comment: Socially  . Drug use: No     Allergies   Patient has no known allergies.   Review of Systems Review of Systems  A complete review of systems was obtained and all systems are negative except as noted in the HPI and PMH.   Physical Exam Updated Vital Signs BP (!) 145/89   Pulse 80   Temp 98.2 F (36.8 C) (Oral)   Resp 17   LMP 01/26/2016 (Approximate)   SpO2 98%   Physical Exam  Constitutional: She is oriented to person, place, and time. She appears well-developed and well-nourished. No distress.  HENT:  Head: Normocephalic and atraumatic.  Mouth/Throat: Oropharynx is clear and moist.  Eyes: Conjunctivae and EOM are normal. Pupils are equal, round, and reactive to light.  Neck: Normal range of motion.  Cardiovascular: Normal rate, regular rhythm and intact distal pulses.  Pulmonary/Chest: Effort normal and breath sounds normal.  Abdominal: Soft. There is no tenderness.  Genitourinary:  Genitourinary Comments: Pelvic exam is chaperoned by nurse: No rashes or lesions, no significant  vaginal discharge, no adnexal or cervical motion tenderness  Musculoskeletal: Normal range of motion.  Neurological: She is alert and oriented to person, place, and time.  Skin: She is not diaphoretic.  Psychiatric: She has a normal mood and affect.  Nursing note and vitals reviewed.    ED Treatments / Results  Labs (all labs ordered are listed, but only abnormal results are displayed) Labs Reviewed  WET PREP, GENITAL - Abnormal; Notable for the following components:      Result Value   Clue Cells Wet Prep HPF POC PRESENT (*)    WBC, Wet Prep HPF POC FEW (*)    All other components within normal limits  URINALYSIS, ROUTINE W REFLEX MICROSCOPIC - Abnormal;  Notable for the following components:   APPearance HAZY (*)    All other components within normal limits  BASIC METABOLIC PANEL - Abnormal; Notable for the following components:   Glucose, Bld 165 (*)    All other components within normal limits  CBC WITH DIFFERENTIAL/PLATELET  POC URINE PREG, ED  GC/CHLAMYDIA PROBE AMP ([place]) NOT AT [facility]    EKG  EKG Interpretation None       Radiology No results found.  Procedures Procedures (including critical care time)  Medications Ordered in ED Medications  iopamidol (ISOVUE-300) 61 % injection (not administered)  dicyclomine (BENTYL) injection 20 mg (20 mg Intramuscular Given 02/06/18 1512)  ketorolac (TORADOL) 30 MG/ML injection 30 mg (30 mg Intravenous Given 02/06/18 1511)     Initial Impression / Assessment and Plan / ED Course  I have reviewed the triage vital signs and the nursing notes.  Pertinent labs & imaging results that were available during my care of the patient were reviewed by me and considered in my medical decision making (see chart for details).     Vitals:   02/06/18 1055 02/06/18 1415 02/06/18 1500  BP: (!) 178/100 139/79 (!) 145/89  Pulse: 80 75 80  Resp: 17    Temp: 98.2 F (36.8 C)    TempSrc: Oral    SpO2: 98% 99% 98%    Medications  iopamidol (ISOVUE-300) 61 % injection (not administered)  dicyclomine (BENTYL) injection 20 mg (20 mg Intramuscular Given 02/06/18 1512)  ketorolac (TORADOL) 30 MG/ML injection 30 mg (30 mg Intravenous Given 02/06/18 1511)    Janice Singleton is 52 y.o. female presenting with lower abdominal pain no other associated symptoms.  She reporting pain in her right hand and bilateral feet.  Patient is neurovascularly intact with no focal bony tenderness, this does not appear to affect the joints.  Blood pressure today is elevated, she states she has been noncompliant with her hypertensive medications.  It does not appear that insurance is an issue, she states that she will  start retaking her medications if I refill them.  Pelvic exam unremarkable.  Wet prep with clue cells however patient is not complaining of vaginal discharge, no indication to treat bacterial vaginosis in this scenario.  Case signed out to [place] law at shift change: Plan to follow up CT   Final Clinical Impressions(s) / ED Diagnoses   Final diagnoses:  Pain    ED Discharge Orders        Ordered    amLODipine (NORVASC) 5 MG tablet  Daily     02/06/18 1525       Janice Singleton, Janice Singleton 02/06/18 1531    Tilden Fossa, MD 02/07/18 [phone]

## 2018-02-06 NOTE — Discharge Instructions (Addendum)
For pain control please take ibuprofen (also known as Motrin or Advil) 800mg  (this is normally 4 over the counter pills) 3 times a day  for 5 days. Take with food to minimize stomach irritation.  Take acetaminophen (Tylenol) up to 975 mg (this is normally 3 over-the-counter pills) up to 3 times a day. Do not drink alcohol. Make sure your other medications do not contain acetaminophen (Read the labels!)  Do not hesitate to return to the emergency room for any new, worsening or concerning symptoms.  Please follow-up with OB/GYN as outlined below for further evaluation of the mass found on your uterus today.  Please call their office, but they will also try to contact you to schedule appointment.  Please obtain primary care using resource guide below. Let them know that you were seen in the emergency room and that they will need to obtain records for further outpatient management.  It is recommended that you get further imaging (through primary care provider) to assess the abnormal finding on your liver (possible hemangioma), in addition to following up with OB/GYN if OB/GYN does not assess this for you.  You can call the number circled on your discharge paperwork, outlined in black, to help you find a primary care provider.

## 2018-02-06 NOTE — ED Triage Notes (Signed)
Patien presents to ED for assessment of fullness feeling in her lower mid abdomen, with increased urinary frequency and intermittent pain to her lower back.  Patient also c/o swelling to her right hand which has been going on for a week, and also pain in both plantar aspects of her feet with ambulation.

## 2018-02-07 LAB — GC/CHLAMYDIA PROBE AMP (~~LOC~~) NOT AT ARMC
Chlamydia: NEGATIVE
Neisseria Gonorrhea: NEGATIVE

## 2018-02-13 ENCOUNTER — Telehealth: Payer: Self-pay | Admitting: Family Medicine

## 2018-02-13 NOTE — Telephone Encounter (Signed)
Called and spoke to pt to remind them of their apt tomorrow. Advised of building number and time restrictions. °

## 2018-02-14 ENCOUNTER — Ambulatory Visit: Payer: BLUE CROSS/BLUE SHIELD | Admitting: Family Medicine

## 2018-02-14 ENCOUNTER — Other Ambulatory Visit: Payer: Self-pay

## 2018-02-14 ENCOUNTER — Telehealth: Payer: Self-pay | Admitting: Family Medicine

## 2018-02-14 ENCOUNTER — Encounter: Payer: Self-pay | Admitting: Family Medicine

## 2018-02-14 VITALS — BP 174/98 | HR 69 | Temp 98.8°F | Ht 63.78 in | Wt 100.6 lb

## 2018-02-14 DIAGNOSIS — I10 Essential (primary) hypertension: Secondary | ICD-10-CM | POA: Diagnosis not present

## 2018-02-14 DIAGNOSIS — R932 Abnormal findings on diagnostic imaging of liver and biliary tract: Secondary | ICD-10-CM

## 2018-02-14 DIAGNOSIS — R109 Unspecified abdominal pain: Secondary | ICD-10-CM | POA: Insufficient documentation

## 2018-02-14 DIAGNOSIS — N859 Noninflammatory disorder of uterus, unspecified: Secondary | ICD-10-CM | POA: Diagnosis not present

## 2018-02-14 DIAGNOSIS — R1030 Lower abdominal pain, unspecified: Secondary | ICD-10-CM | POA: Diagnosis not present

## 2018-02-14 DIAGNOSIS — R21 Rash and other nonspecific skin eruption: Secondary | ICD-10-CM | POA: Diagnosis not present

## 2018-02-14 DIAGNOSIS — Z1231 Encounter for screening mammogram for malignant neoplasm of breast: Secondary | ICD-10-CM | POA: Diagnosis not present

## 2018-02-14 DIAGNOSIS — B9689 Other specified bacterial agents as the cause of diseases classified elsewhere: Secondary | ICD-10-CM | POA: Diagnosis not present

## 2018-02-14 DIAGNOSIS — N760 Acute vaginitis: Secondary | ICD-10-CM

## 2018-02-14 DIAGNOSIS — R739 Hyperglycemia, unspecified: Secondary | ICD-10-CM

## 2018-02-14 DIAGNOSIS — N858 Other specified noninflammatory disorders of uterus: Secondary | ICD-10-CM | POA: Insufficient documentation

## 2018-02-14 DIAGNOSIS — R102 Pelvic and perineal pain: Secondary | ICD-10-CM | POA: Insufficient documentation

## 2018-02-14 MED ORDER — LISINOPRIL 10 MG PO TABS
10.0000 mg | ORAL_TABLET | Freq: Every day | ORAL | 3 refills | Status: DC
Start: 2018-02-14 — End: 2018-02-28

## 2018-02-14 MED ORDER — TRAMADOL HCL 50 MG PO TABS: 50.0000 mg | ORAL_TABLET | Freq: Three times a day (TID) | ORAL | 0 refills | Status: DC | PRN

## 2018-02-14 MED ORDER — TRIAMCINOLONE ACETONIDE 0.1 % EX CREA: 1.0000 "application " | TOPICAL_CREAM | Freq: Two times a day (BID) | CUTANEOUS | 0 refills | Status: DC

## 2018-02-14 MED ORDER — METRONIDAZOLE 500 MG PO TABS: 500.0000 mg | ORAL_TABLET | Freq: Two times a day (BID) | ORAL | 0 refills | Status: DC

## 2018-02-14 MED ORDER — AMLODIPINE BESYLATE 10 MG PO TABS: 10.0000 mg | ORAL_TABLET | Freq: Every day | ORAL | 3 refills | Status: DC

## 2018-02-14 NOTE — Progress Notes (Deleted)
   3/7/201911:08 AM  Larence Penning May 08, 1966, 52 y.o. female [id number]  Chief Complaint  Patient presents with  . Establish Care    Releeased from ER 2/27. Mass found on uterus, futher imaging is needed to assess the abnormal findings on liver    HPI:   Patient is a 52 y.o. female with past medical history significant for *** who presents today for ***  Depression screen PHQ 2/9 02/14/2018  Decreased Interest 0  Down, Depressed, Hopeless 0  PHQ - 2 Score 0    Allergies  Allergen Reactions  . Other Other (See Comments)    ALL antibiotics cause yeast infections    Prior to Admission medications   Medication Sig Start Date End Date Taking? Authorizing Provider  amLODipine (NORVASC) 5 MG tablet Take 1 tablet (5 mg total) by mouth daily. 02/29/16  Yes Rasch, Victorino Dike I, NP  ibuprofen (ADVIL,MOTRIN) 800 MG tablet Take 1 tablet (800 mg total) by mouth 3 (three) times daily. 02/06/18  Yes Law, Waylan Boga, PA-C  acetaminophen (TYLENOL) 500 MG tablet Take 1 tablet (500 mg total) by mouth every 6 (six) hours as needed. Patient not taking: Reported on 02/14/2018 02/06/18   Emi Holes, PA-C  amLODipine (NORVASC) 5 MG tablet Take 1 tablet (5 mg total) by mouth daily. Patient not taking: Reported on 02/06/2018 01/27/17   Trixie Dredge, PA-C  amLODipine (NORVASC) 5 MG tablet Take 1 tablet (5 mg total) by mouth daily. Patient not taking: Reported on 02/14/2018 02/06/18   Pisciotta, Joni Reining, PA-C  metroNIDAZOLE (METROGEL VAGINAL) 0.75 % vaginal gel Place 1 Applicatorful vaginally at bedtime. Patient not taking: Reported on 02/06/2018 02/29/16   Rasch, Harolyn Rutherford, NP    Past Medical History:  Diagnosis Date  . Acid reflux   . Constipation   . Hypertension     Past Surgical History:  Procedure Laterality Date  . CESAREAN SECTION      Social History   Tobacco Use  . Smoking status: Current Every Day Smoker    Packs/day: 1.00    Types: Cigarettes  . Smokeless tobacco: Never Used    Substance Use Topics  . Alcohol use: Yes    Comment: Socially    Family History  Problem Relation Age of Onset  . Alcohol abuse Mother   . Hypertension Father   . Hypertension Sister   . Hypertension Brother   . Healthy Daughter     ROS   OBJECTIVE:  Blood pressure (!) 174/98, pulse 69, temperature 98.8 F (37.1 C), temperature source Oral, height 5' 3.78" (1.62 m), weight 100 lb 9.6 oz (45.6 kg), last menstrual period 01/26/2016, SpO2 98 %.  Physical Exam  No results found for this or any previous visit (from the past 24 hour(s)).  No results found.   ASSESSMENT and PLAN  ***  No Follow-up on file.    Myles Lipps, MD Primary Care at [facility] [address] [place], [place] [address] Ph.  [phone] Fax [phone]

## 2018-02-14 NOTE — Telephone Encounter (Signed)
Copied from CRM [id number]. Topic: Quick Communication - See Telephone Encounter >> Feb 14, 2018  3:08 PM Oneal GroutSebastian, Jennifer S wrote: CRM for notification. See Telephone encounter for:  With Antibiotic being called in, patient  requesting diflucan called into [facility] on [address] 02/14/18.

## 2018-02-14 NOTE — Patient Instructions (Signed)
     IF you received an x-ray today, you will receive an invoice from [facility]. Please contact [facility] at [phone] with questions or concerns regarding your invoice.   IF you received labwork today, you will receive an invoice from [facility]. Please contact [facility] at [phone] with questions or concerns regarding your invoice.   Our billing staff will not be able to assist you with questions regarding bills from these companies.  You will be contacted with the lab results as soon as they are available. The fastest way to get your results is to activate your My Chart account. Instructions are located on the last page of this paperwork. If you have not heard from us regarding the results in 2 weeks, please contact this office.     

## 2018-02-14 NOTE — Progress Notes (Signed)
Subjective:    Patient ID: Janice Singleton, female    DOB: 1966-07-27, 52 y.o.   MRN: [id number]  Chief Complaint  Patient presents with  . Establish Care    Released from ER 2/27. Mass found on uterus. Futher imaging is needed to assess the abnormal findings on liver. Requesting referral for mammagram and wanting cologuard.   HPI   Patient presents today for follow-up to ER on 02/06/2018 for lower abdominal pain that was thought to be related to uterine mass found on CT scan. Patient missed three days from work due to abdominal pain. Patient has been taking ibuprofen 800 mg and acetaminophen prn as needed for pain, but it hasn't helped. Patient states that pain is a 7/10 today.   Workup in ER also revealed BV, patient not prescribed flagyl Patient is requesting referral to Gyn for further eval of uterine mass  There was an incidental finding of enhancing lesion in the right lobe of the liver. Question slightly atypical hemangioma. A liver metastasis could present in this manner. Patient did not have LFTs done.   Patient has not had routine medical care in years. HCM is not uptodate.  She is due for mammogram, colon cancer screening and pap She has never been tested for HCV She had a negative HIV done during most recent ER visit  Patient reports having h/o HTN. She is currently taking Amlodipine PO daily for hypertension. Patient states she takes it as directed. Her blood pressure today was 174/98 mm Hg.  Allergies  Allergen Reactions  . Other Other (See Comments)    ALL antibiotics cause yeast infections   Prior to Admission medications   Medication Sig Start Date End Date Taking? Authorizing Provider  amLODipine (NORVASC) 5 MG tablet Take 1 tablet (5 mg total) by mouth daily. 02/06/18  Yes   ibuprofen (ADVIL,MOTRIN) 800 MG tablet Take 1 tablet (800 mg total) by mouth 3 (three) times daily. 02/06/18  Yes Law, Waylan Boga, PA-C   Past Medical History:  Diagnosis Date  . Acid reflux    . Constipation   . Diabetes mellitus without complication (HCC)    Gestational   . Hypertension   . Pre-eclampsia    Social History   Tobacco Use  . Smoking status: Current Every Day Smoker    Packs/day: 2.00    Types: Cigarettes  . Smokeless tobacco: Never Used  Substance Use Topics  . Alcohol use: Yes    Alcohol/week: 12.6 oz    Types: 21 Cans of beer per week    Comment: 3 beers a day  . Drug use: No   Family History  Problem Relation Age of Onset  . Alcohol abuse Mother   . Hypertension Father   . Hypertension Sister   . Hypertension Brother   . Healthy Daughter    Past Surgical History:  Procedure Laterality Date  . CESAREAN SECTION      Review of Systems  Constitutional: Positive for fatigue. Negative for appetite change, chills, diaphoresis, fever and unexpected weight change.  HENT: Negative.  Negative for congestion, ear pain, rhinorrhea, sinus pressure and sinus pain.   Eyes: Negative.  Negative for pain and visual disturbance.  Respiratory: Positive for cough. Negative for chest tightness and shortness of breath.   Cardiovascular: Negative.  Negative for chest pain and palpitations.  Gastrointestinal: Positive for abdominal pain and constipation ("Last time I pooped was Monday."). Negative for blood in stool, diarrhea, nausea, rectal pain and vomiting.  Endocrine: Positive for  polydipsia and polyuria.  Genitourinary: Positive for frequency. Negative for difficulty urinating, dysuria and urgency.  Musculoskeletal: Negative for arthralgias and myalgias.  Skin: Positive for rash.  Allergic/Immunologic: Negative for environmental allergies and food allergies.  Neurological: Positive for numbness (Occasional numbness and burning in hands and feet.). Negative for dizziness and headaches.  Hematological: Negative for adenopathy. Does not bruise/bleed easily.  Psychiatric/Behavioral: Negative.  Negative for dysphoric mood and sleep disturbance. The patient is not  nervous/anxious.        Objective:   Physical Exam  Constitutional: She is oriented to person, place, and time. She appears well-developed. No distress.  Patient is underweight.   BP (!) 174/98 (BP Location: Left Arm, Patient Position: Sitting, Cuff Size: Normal)   Pulse 69   Temp 98.8 F (37.1 C) (Oral)   Ht 5' 3.78" (1.62 m)   Wt 100 lb 9.6 oz (45.6 kg)   LMP 01/26/2016 (Approximate)   SpO2 98%   BMI 17.39 kg/m   HENT:  Head: Normocephalic and atraumatic.  Right Ear: External ear normal.  Left Ear: External ear normal.  Nose: Nose normal.  Mouth/Throat: Oropharynx is clear and moist.  Eyes: Conjunctivae and EOM are normal. Pupils are equal, round, and reactive to light.  Neck: Normal range of motion. Neck supple.  Cardiovascular: Normal rate, regular rhythm, normal heart sounds and intact distal pulses.  Pulmonary/Chest: Effort normal. She has wheezes.  Scattered expiratory wheezes.   Abdominal: Soft. Bowel sounds are normal. There is tenderness (lower abdomen). There is no rebound and no guarding.  Musculoskeletal: Normal range of motion.  Neurological: She is alert and oriented to person, place, and time. She has normal reflexes.  Skin: Skin is warm and dry. Rash noted. She is not diaphoretic.  Patches of hyperpigmentation that began on left arm and have spread to rest of body. Patches vary in size but are all less than 1 cm in diameter, are non-flaky, and are in a diffuse pattern. Patches are itchy.  Psychiatric: She has a normal mood and affect. Her behavior is normal.      Assessment & Plan:   1. Uterine mass A uterine mass was identified on CT on 02/06/2018.  Patient was referred to OB/GYN for further evaluation and management.  - Ambulatory referral to Obstetrics / Gynecology  2. Abnormal findings on diagnostic imaging of liver Patient with incidental finding on CT scan, referring to GI for further evaluation and management - Ambulatory referral to  Gastroenterology  3. Lower abdominal pain Presumed to be secondary to uterine mass. Pain not well controlled in Ibuprofen 800mg  TID, adding a limited prescription for Tramadol 50 mg every 8 hours prn. Discussed new med r/se/b.  4. Bacterial vaginosis On patient's ED visit on 02/06/2018, clue cells were present on wet prep. Flagyl was prescribed for management of BV.  5. Essential hypertension, benign Patient has a history of uncontrolled hypertension. In office today her blood pressure was 174/98 mm Hg. Patient is currently taking Amlodipine 5 mg PO daily for hypertension. Patient's amlodipine dose was increased to 10 mg PO daily. Lisinopril 10 mg PO daily was added to patient's medication regime.   6. Visit for screening mammogram Patient is due for a mammogram to screen for breast cancer. Order was placed for mammogram. - MM DIGITAL SCREENING BILATERAL; Future  7. Elevated serum glucose On 02/06/2018, patient's glucose on BMP was 165 mg/dL. Patient has a history of gestational diabetes. Additionally, patient reports polydipsia, polyuria, and numbness in her hands and  feet. A hemoglobin A1c was done.  - Hemoglobin A1c  8. Rash and nonspecific skin eruption Dry skin with mild eczema. Recommended mixing Vaseline into Nivea (patient's usual skin care routine,) to help with skin dryness. Tropical triamconoline was prescribed for rash.  Patient was seen with PA-C student. Patient independently interviewed and examined by me.  Return in about 2 weeks (around 02/28/2018).

## 2018-02-14 NOTE — Telephone Encounter (Signed)
Called patient and spoke with patient directly regarding prescription for Flagyl. Patient was advised to take Flagyl 500 mg BID for seven days for her Bacterial Vaginosis. Patient was advised to NOT DRINK ALCOHOL while on Flagyl.

## 2018-02-15 ENCOUNTER — Encounter: Payer: Self-pay | Admitting: Family Medicine

## 2018-02-15 LAB — HEMOGLOBIN A1C
Est. average glucose Bld gHb Est-mCnc: 114 mg/dL
Hgb A1c MFr Bld: 5.6 % (ref 4.8–5.6)

## 2018-02-28 ENCOUNTER — Ambulatory Visit: Payer: BLUE CROSS/BLUE SHIELD | Admitting: Family Medicine

## 2018-02-28 ENCOUNTER — Other Ambulatory Visit: Payer: Self-pay | Admitting: Family Medicine

## 2018-02-28 ENCOUNTER — Other Ambulatory Visit: Payer: Self-pay

## 2018-02-28 ENCOUNTER — Encounter: Payer: Self-pay | Admitting: Family Medicine

## 2018-02-28 VITALS — BP 146/86 | HR 79 | Temp 98.0°F | Ht 61.0 in | Wt 102.4 lb

## 2018-02-28 DIAGNOSIS — I10 Essential (primary) hypertension: Secondary | ICD-10-CM | POA: Diagnosis not present

## 2018-02-28 DIAGNOSIS — R932 Abnormal findings on diagnostic imaging of liver and biliary tract: Secondary | ICD-10-CM

## 2018-02-28 DIAGNOSIS — R0981 Nasal congestion: Secondary | ICD-10-CM | POA: Diagnosis not present

## 2018-02-28 DIAGNOSIS — F17200 Nicotine dependence, unspecified, uncomplicated: Secondary | ICD-10-CM | POA: Diagnosis not present

## 2018-02-28 DIAGNOSIS — R1030 Lower abdominal pain, unspecified: Secondary | ICD-10-CM

## 2018-02-28 MED ORDER — LISINOPRIL 20 MG PO TABS: 20.0000 mg | ORAL_TABLET | Freq: Every day | ORAL | 3 refills | Status: DC

## 2018-02-28 MED ORDER — VARENICLINE TARTRATE 0.5 MG X 11 & 1 MG X 42 PO MISC: ORAL | 0 refills | Status: DC

## 2018-02-28 MED ORDER — FLUTICASONE PROPIONATE 50 MCG/ACT NA SUSP: 1.0000 | Freq: Every day | NASAL | 6 refills | Status: DC

## 2018-02-28 NOTE — Progress Notes (Signed)
3/21/20195:27 PM  Janice Singleton 10/02/1966, 52 y.o. female [id number]  Chief Complaint  Patient presents with  . Follow-up    FOLLOW UP FOR UTERINE MASS. STILL HAVING PAIN IN ABDOMIN HAS GYN APPT 3/28. WANTS HELP TO STOP SMOKING. USUALLY SMOKES 1 1/2 PK A DAY    HPI:   Patient is a 52 y.o. female with past medical history significant for HTN who presents today for followup on BP.  1. Uterine mass - sees gyn next week, continues to have pelvic pain 2. HTN - tolerating BP meds, today reading much improved 3. Nasal congestion - mostly at night, makes breathing/sleeping difficult, has been taking oral antihistamine wo results 4. Abnormal findings on ct scan of liver - patient denies every being a heavy drinker. She used to drink a 16 ounce can of beer on occasion if she was having bad insomnia. Has not had any alcohol in months. Referral to GI sent yesterday 5. Patient smokes about 1.5 pdd, > 20 years. She is interested in chantix. She has never stopped smoking before. Tried patches once, gave her a rash.   Depression screen [facility] 2/9 02/28/2018 02/14/2018  Decreased Interest 0 0  Down, Depressed, Hopeless 0 0  PHQ - 2 Score 0 0    Allergies  Allergen Reactions  . Other Other (See Comments)    ALL antibiotics cause yeast infections    Prior to Admission medications   Medication Sig Start Date End Date Taking? Authorizing Provider  amLODipine (NORVASC) 10 MG tablet Take 1 tablet (10 mg total) by mouth daily. 02/14/18  Yes Myles Lipps, MD  ibuprofen (ADVIL,MOTRIN) 800 MG tablet Take 1 tablet (800 mg total) by mouth 3 (three) times daily. 02/06/18  Yes Law, Waylan Boga, PA-C  lisinopril (PRINIVIL,ZESTRIL) 10 MG tablet Take 1 tablet (10 mg total) by mouth daily. 02/14/18  Yes Myles Lipps, MD  metroNIDAZOLE (FLAGYL) 500 MG tablet Take 1 tablet (500 mg total) by mouth 2 (two) times daily with a meal. DO NOT CONSUME ALCOHOL WHILE TAKING THIS MEDICATION. 02/14/18  Yes Myles Lipps, MD    traMADol (ULTRAM) 50 MG tablet Take 1 tablet (50 mg total) by mouth every 8 (eight) hours as needed. 02/14/18  Yes Myles Lipps, MD  triamcinolone cream (KENALOG) 0.1 % Apply 1 application topically 2 (two) times daily. 02/14/18  Yes Myles Lipps, MD    Past Medical History:  Diagnosis Date  . Acid reflux   . Constipation   . Diabetes mellitus without complication (HCC)    Gestational   . Hypertension   . Pre-eclampsia     Past Surgical History:  Procedure Laterality Date  . CESAREAN SECTION      Social History   Tobacco Use  . Smoking status: Current Every Day Smoker    Packs/day: 2.00    Types: Cigarettes  . Smokeless tobacco: Never Used  Substance Use Topics  . Alcohol use: Yes    Alcohol/week: 12.6 oz    Types: 21 Cans of beer per week    Comment: 3 beers a day    Family History  Problem Relation Age of Onset  . Alcohol abuse Mother   . Hypertension Father   . Hypertension Sister   . Hypertension Brother   . Healthy Daughter     ROS Per hpi Neg: fever, chills, CP, SOB, nausea, vomiting, icteric sclera  OBJECTIVE:  Blood pressure (!) 146/86, pulse 79, temperature 98 F (36.7 C), temperature source Oral, height  5\' 1"  (1.549 m), weight 102 lb 6.4 oz (46.4 kg), last menstrual period 01/26/2016, SpO2 98 %.  Physical Exam  Gen: thin, well groomed, looks older than stated age, NAD HEENT: [place]/AT, PERRLA, EOMI, non-icteric sclera Resp: normal effort, no distress Skin: no rashes noted, warm and dry Neuro: Gait is normal, AAOx3 Psych: Mood and affect are congruent, euthymic   ASSESSMENT and PLAN  1. Essential hypertension, benign Improved. Tolerating meds well. Increasing lisinopril to 20mg  daily. continue with amlodipine 10mg  daily.  - Comprehensive metabolic panel - lisinopril (PRINIVIL,ZESTRIL) 20 MG tablet; Take 1 tablet (20 mg total) by mouth daily.  2. Abnormal findings on diagnostic imaging of liver Pending GI evaluation, basic labs ordered,  discussed avoidance of liver toxins - Comprehensive metabolic panel - HCV Ab w/Rflx to Verification - Hepatitis B surface antigen - Ferritin  3. Nasal congestion Starting flonase at bedtime, discussed nasal saline washes and humidifier.  4. Tobacco use disorder Discussed chantix r/se/b. Discussed titration and importance of quit date.   Other orders - fluticasone (FLONASE) 50 MCG/ACT nasal spray; Place 1 spray into both nostrils at bedtime. - varenicline (CHANTIX STARTING MONTH PAK) 0.5 MG X 11 & 1 MG X 42 tablet; Take one 0.5 mg tablet by mouth once daily for 3 days, then increase to one 0.5 mg tablet twice daily for 4 days, then increase to one 1 mg tablet twice daily.  Return in about 1 month (around 03/28/2018).    [name], MD [facility] at [facility] [address] [place], [place] [address] Ph.  [phone] Fax [phone]

## 2018-02-28 NOTE — Patient Instructions (Signed)
     IF you received an x-ray today, you will receive an invoice from [facility]. Please contact  Radiology at [phone] with questions or concerns regarding your invoice.   IF you received labwork today, you will receive an invoice from [facility]. Please contact [facility] at [phone] with questions or concerns regarding your invoice.   Our billing staff will not be able to assist you with questions regarding bills from these companies.  You will be contacted with the lab results as soon as they are available. The fastest way to get your results is to activate your My Chart account. Instructions are located on the last page of this paperwork. If you have not heard from us regarding the results in 2 weeks, please contact this office.     

## 2018-02-28 NOTE — Telephone Encounter (Signed)
Copied from CRM [id number]. Topic: Quick Communication - Rx Refill/Question >> Feb 28, 2018  6:30 PM Zada GirtLander, [place] wrote: Medication: tramedol? (pt says Leretha PolSantiago was supposed to call I pain medicine for stomach pain) Has the patient contacted their pharmacy? Yes.   (Agent: If no, request that the patient contact the pharmacy for the refill.) Preferred Pharmacy (with phone number or street name): [facility] [phone]#[id number] [place] [address] [address] [place] [address] Phone: [phone] Fax: [phone]   Agent: Please be advised that RX refills may take up to 3 business days. We ask that you follow-up with your pharmacy.

## 2018-02-28 NOTE — Telephone Encounter (Signed)
Pt requesting refill of Tramadol.   LOV: 02/21/18  Dr. Leretha PolSantiago  [facility] [address]

## 2018-03-01 LAB — COMPREHENSIVE METABOLIC PANEL
ALT: 10 IU/L (ref 0–32)
AST: 15 IU/L (ref 0–40)
Albumin/Globulin Ratio: 1.3 (ref 1.2–2.2)
Albumin: 3.9 g/dL (ref 3.5–5.5)
Alkaline Phosphatase: 95 IU/L (ref 39–117)
BUN/Creatinine Ratio: 20 (ref 9–23)
BUN: 12 mg/dL (ref 6–24)
Bilirubin Total: 0.3 mg/dL (ref 0.0–1.2)
CO2: 21 mmol/L (ref 20–29)
Calcium: 9.1 mg/dL (ref 8.7–10.2)
Chloride: 107 mmol/L — ABNORMAL HIGH (ref 96–106)
Creatinine, Ser: 0.61 mg/dL (ref 0.57–1.00)
GFR calc Af Amer: 119 mL/min/{1.73_m2} (ref >59)
GFR calc non Af Amer: 103 mL/min/{1.73_m2} (ref >59)
Globulin, Total: 3 g/dL (ref 1.5–4.5)
Glucose: 95 mg/dL (ref 65–99)
Potassium: 3.7 mmol/L (ref 3.5–5.2)
Sodium: 140 mmol/L (ref 134–144)
Total Protein: 6.9 g/dL (ref 6.0–8.5)

## 2018-03-01 LAB — HEPATITIS B SURFACE ANTIGEN: Hepatitis B Surface Ag: NEGATIVE

## 2018-03-01 LAB — HCV INTERPRETATION

## 2018-03-01 LAB — HCV AB W/RFLX TO VERIFICATION: HCV Ab: <0.1 s/co ratio (ref 0.0–0.9)

## 2018-03-01 LAB — FERRITIN: Ferritin: 136 ng/mL (ref 15–150)

## 2018-03-02 NOTE — Telephone Encounter (Signed)
Patient is requesting a refill of the following medications: Requested Prescriptions   Pending Prescriptions Disp Refills  . traMADol (ULTRAM) 50 MG tablet 15 tablet 0    Sig: Take 1 tablet (50 mg total) by mouth every 8 (eight) hours as needed.    Date of patient request: 02/28/18 Last office visit: 02/28/18 ( for HTN and abd pain Date of last refill: 02/14/18 Last refill amount: 15 tab prn 8 hrs Follow up time period per chart: 03/28/18

## 2018-03-04 ENCOUNTER — Encounter: Payer: Self-pay | Admitting: *Deleted

## 2018-03-04 MED ORDER — TRAMADOL HCL 50 MG PO TABS: 50.0000 mg | ORAL_TABLET | Freq: Three times a day (TID) | ORAL | 0 refills | Status: DC | PRN

## 2018-03-05 ENCOUNTER — Telehealth: Payer: Self-pay | Admitting: Family Medicine

## 2018-03-05 DIAGNOSIS — N63 Unspecified lump in unspecified breast: Secondary | ICD-10-CM

## 2018-03-05 NOTE — Telephone Encounter (Signed)
Please Advise. Thanks!

## 2018-03-05 NOTE — Telephone Encounter (Signed)
Copied from CRM [phone]. Topic: Quick Communication - See Telephone Encounter >> Mar 05, 2018  1:32 PM [name], Janice S wrote: CRM for notification. See Telephone encounter for: 03/05/18. Pt called stating that [facility] and order needs updated to a diagnostic mammogram bc she found a bump on the inside of her R breast.

## 2018-03-07 ENCOUNTER — Other Ambulatory Visit: Payer: Self-pay | Admitting: Family Medicine

## 2018-03-07 ENCOUNTER — Ambulatory Visit (INDEPENDENT_AMBULATORY_CARE_PROVIDER_SITE_OTHER): Payer: BLUE CROSS/BLUE SHIELD | Admitting: Obstetrics and Gynecology

## 2018-03-07 ENCOUNTER — Encounter: Payer: Self-pay | Admitting: Obstetrics and Gynecology

## 2018-03-07 VITALS — BP 116/78 | HR 88 | Ht 61.0 in | Wt 100.0 lb

## 2018-03-07 DIAGNOSIS — Z1151 Encounter for screening for human papillomavirus (HPV): Secondary | ICD-10-CM

## 2018-03-07 DIAGNOSIS — Z113 Encounter for screening for infections with a predominantly sexual mode of transmission: Secondary | ICD-10-CM

## 2018-03-07 DIAGNOSIS — N859 Noninflammatory disorder of uterus, unspecified: Secondary | ICD-10-CM

## 2018-03-07 DIAGNOSIS — Z124 Encounter for screening for malignant neoplasm of cervix: Secondary | ICD-10-CM

## 2018-03-07 DIAGNOSIS — N63 Unspecified lump in unspecified breast: Secondary | ICD-10-CM

## 2018-03-07 DIAGNOSIS — N858 Other specified noninflammatory disorders of uterus: Secondary | ICD-10-CM

## 2018-03-07 NOTE — Progress Notes (Signed)
Ms Janice Singleton presents for eval of uterine mass noted on CT scan 2/19. Pt presented to ER for abd pain. CT scan noted uterine mass. Pt reports LMP 1 yr ago. No PMB. Some menopausal Sx. Not sexual active. H/O gonorrhea at age 52 Last pap unknown Mammogram scheduled as per PCP C section x 1 d/t PEC at 32 weeks G1P0101  PE AF  VSS Lungs clear  Heart RRR Abd soft + BS abd/pelvic mass effect GU Nl EGBUS cervix no lesions pap smear obtained uterus enlarged deviated to the left  A/P Enlarge uterus GYN U/S ordered for further eval. Pap smear completed today  F/U after U/S completed to discuss further management

## 2018-03-07 NOTE — Telephone Encounter (Signed)
[facility]

## 2018-03-07 NOTE — Telephone Encounter (Signed)
Done. thanks

## 2018-03-12 ENCOUNTER — Other Ambulatory Visit: Payer: Self-pay | Admitting: Family Medicine

## 2018-03-12 ENCOUNTER — Ambulatory Visit
Admission: RE | Admit: 2018-03-12 | Discharge: 2018-03-12 | Disposition: A | Discharge status: Home / Self Care | Payer: BLUE CROSS/BLUE SHIELD | Source: Ambulatory Visit | Attending: Family Medicine | Admitting: Family Medicine

## 2018-03-12 DIAGNOSIS — N63 Unspecified lump in unspecified breast: Secondary | ICD-10-CM

## 2018-03-12 DIAGNOSIS — R922 Inconclusive mammogram: Secondary | ICD-10-CM | POA: Diagnosis not present

## 2018-03-12 DIAGNOSIS — N6489 Other specified disorders of breast: Secondary | ICD-10-CM | POA: Diagnosis not present

## 2018-03-12 LAB — CYTOLOGY - PAP
Adequacy: ABSENT
Chlamydia: NEGATIVE
Diagnosis: NEGATIVE
HPV: NOT DETECTED
NEISSERIA GONORRHEA: NEGATIVE

## 2018-03-14 ENCOUNTER — Ambulatory Visit (HOSPITAL_COMMUNITY)
Admission: RE | Admit: 2018-03-14 | Discharge: 2018-03-14 | Disposition: A | Discharge status: Home / Self Care | Payer: BLUE CROSS/BLUE SHIELD | Source: Ambulatory Visit | Attending: Obstetrics and Gynecology | Admitting: Obstetrics and Gynecology

## 2018-03-14 DIAGNOSIS — N859 Noninflammatory disorder of uterus, unspecified: Secondary | ICD-10-CM | POA: Diagnosis not present

## 2018-03-14 DIAGNOSIS — R102 Pelvic and perineal pain: Secondary | ICD-10-CM | POA: Diagnosis not present

## 2018-03-14 DIAGNOSIS — D259 Leiomyoma of uterus, unspecified: Secondary | ICD-10-CM | POA: Diagnosis not present

## 2018-03-14 DIAGNOSIS — R933 Abnormal findings on diagnostic imaging of other parts of digestive tract: Secondary | ICD-10-CM | POA: Diagnosis not present

## 2018-03-14 DIAGNOSIS — R9389 Abnormal findings on diagnostic imaging of other specified body structures: Secondary | ICD-10-CM | POA: Insufficient documentation

## 2018-03-14 DIAGNOSIS — Z1211 Encounter for screening for malignant neoplasm of colon: Secondary | ICD-10-CM | POA: Diagnosis not present

## 2018-03-14 DIAGNOSIS — N858 Other specified noninflammatory disorders of uterus: Secondary | ICD-10-CM

## 2018-03-14 DIAGNOSIS — K5904 Chronic idiopathic constipation: Secondary | ICD-10-CM | POA: Diagnosis not present

## 2018-03-15 ENCOUNTER — Other Ambulatory Visit: Payer: Self-pay | Admitting: Gastroenterology

## 2018-03-15 DIAGNOSIS — K769 Liver disease, unspecified: Secondary | ICD-10-CM

## 2018-03-19 ENCOUNTER — Encounter: Payer: Self-pay | Admitting: General Practice

## 2018-03-20 DIAGNOSIS — D124 Benign neoplasm of descending colon: Secondary | ICD-10-CM | POA: Diagnosis not present

## 2018-03-20 DIAGNOSIS — R933 Abnormal findings on diagnostic imaging of other parts of digestive tract: Secondary | ICD-10-CM | POA: Diagnosis not present

## 2018-03-20 DIAGNOSIS — D127 Benign neoplasm of rectosigmoid junction: Secondary | ICD-10-CM | POA: Diagnosis not present

## 2018-03-20 DIAGNOSIS — Z1211 Encounter for screening for malignant neoplasm of colon: Secondary | ICD-10-CM | POA: Diagnosis not present

## 2018-03-20 DIAGNOSIS — K635 Polyp of colon: Secondary | ICD-10-CM | POA: Diagnosis not present

## 2018-03-20 LAB — HM COLONOSCOPY

## 2018-03-27 ENCOUNTER — Ambulatory Visit
Admission: RE | Admit: 2018-03-27 | Discharge: 2018-03-27 | Disposition: A | Discharge status: Home / Self Care | Payer: BLUE CROSS/BLUE SHIELD | Source: Ambulatory Visit | Attending: Gastroenterology | Admitting: Gastroenterology

## 2018-03-27 DIAGNOSIS — K7689 Other specified diseases of liver: Secondary | ICD-10-CM | POA: Diagnosis not present

## 2018-03-27 DIAGNOSIS — K769 Liver disease, unspecified: Secondary | ICD-10-CM

## 2018-03-27 MED ORDER — GADOBENATE DIMEGLUMINE 529 MG/ML IV SOLN
10.0000 mL | Freq: Once | INTRAVENOUS | Status: AC | PRN
  Administered 2018-03-27: 10 mL via INTRAVENOUS

## 2018-04-04 ENCOUNTER — Ambulatory Visit: Payer: BLUE CROSS/BLUE SHIELD | Admitting: Family Medicine

## 2018-04-09 ENCOUNTER — Ambulatory Visit: Payer: BLUE CROSS/BLUE SHIELD | Admitting: Obstetrics and Gynecology

## 2018-04-11 ENCOUNTER — Ambulatory Visit: Payer: BLUE CROSS/BLUE SHIELD | Admitting: Family Medicine

## 2018-04-11 ENCOUNTER — Encounter: Payer: Self-pay | Admitting: *Deleted

## 2018-06-20 ENCOUNTER — Other Ambulatory Visit: Payer: Self-pay | Admitting: Family Medicine

## 2018-06-20 DIAGNOSIS — I10 Essential (primary) hypertension: Secondary | ICD-10-CM

## 2018-06-20 NOTE — Telephone Encounter (Signed)
[facility] called and spoke to Federal-MogulLatoya, Pensions consultantTechnician. I asked did Lisinopril get refilled today, because it says it on the refill request generated from the pharmacy. She [facility] and the original date is 02/28/18. As of now, there are no more refills left.  Lisinopril refill Last OV:02/28/18 Last refill:02/28/18 30 tab/3 refills ZOX:[id number]:Santiago [facility] [place], [place] - [address] [phone] (Phone) [phone] (Fax)   Sent to the office because patient was due for a follow up for HTN per last OV note and she has missed several appointments.

## 2018-08-01 ENCOUNTER — Ambulatory Visit: Payer: BLUE CROSS/BLUE SHIELD | Admitting: Family Medicine

## 2018-08-01 ENCOUNTER — Encounter: Payer: Self-pay | Admitting: Family Medicine

## 2018-08-01 VITALS — BP 116/73 | HR 70 | Temp 98.3°F | Resp 18 | Ht 61.0 in | Wt 101.0 lb

## 2018-08-01 DIAGNOSIS — I10 Essential (primary) hypertension: Secondary | ICD-10-CM

## 2018-08-01 DIAGNOSIS — L2082 Flexural eczema: Secondary | ICD-10-CM | POA: Diagnosis not present

## 2018-08-01 DIAGNOSIS — M79604 Pain in right leg: Secondary | ICD-10-CM

## 2018-08-01 DIAGNOSIS — F17200 Nicotine dependence, unspecified, uncomplicated: Secondary | ICD-10-CM

## 2018-08-01 MED ORDER — VARENICLINE TARTRATE 1 MG PO TABS: 1.0000 mg | ORAL_TABLET | Freq: Two times a day (BID) | ORAL | 0 refills | Status: DC

## 2018-08-01 MED ORDER — TRIAMCINOLONE ACETONIDE 0.1 % EX CREA: 1.0000 "application " | TOPICAL_CREAM | Freq: Two times a day (BID) | CUTANEOUS | 0 refills | Status: DC

## 2018-08-01 MED ORDER — VARENICLINE TARTRATE 0.5 MG X 11 & 1 MG X 42 PO MISC: ORAL | 0 refills | Status: DC

## 2018-08-01 MED ORDER — MELOXICAM 7.5 MG PO TABS: 7.5000 mg | ORAL_TABLET | Freq: Every day | ORAL | 0 refills | Status: DC

## 2018-08-01 MED ORDER — CYCLOBENZAPRINE HCL 5 MG PO TABS: 5.0000 mg | ORAL_TABLET | Freq: Three times a day (TID) | ORAL | 1 refills | Status: DC | PRN

## 2018-08-01 MED ORDER — LISINOPRIL 20 MG PO TABS: 20.0000 mg | ORAL_TABLET | Freq: Every day | ORAL | 1 refills | Status: DC

## 2018-08-01 MED ORDER — AMLODIPINE BESYLATE 10 MG PO TABS: 10.0000 mg | ORAL_TABLET | Freq: Every day | ORAL | 1 refills | Status: DC

## 2018-08-01 NOTE — Progress Notes (Signed)
8/22/201912:35 PM  Janice Singleton 1966/04/15, 52 y.o. female [id number]  Chief Complaint  Patient presents with  . Medication Refill    Amlodipine, Lisinopril, Kenalog    HPI:   Patient is a 52 y.o. female with past medical history significant for HTN, tob use who presents today for routine fu  Last visit 02/2018 Since then saw Gyn for concerns of uterine mass on CT - ended up being several small benign fibroids Saw Dr [address]ta [address], GI for indeterminate liver lesion on same CT - had MRI done, lesion of concern was area of benign focal nodular hyperplasia Had teeth pulled  Took chantix for a month was down to 3-5 a day but was lost to followup due to financial reasons, did not get more refills and now she is back to a pack a day. She would like to get back on chantix  Taking BP meds a rx - no side effects  Having right ant thigh pain down to her foot,  pain and stiff, makes her limp Happens daily Positive numbness or tingling Walking makes it better  Has scheduled repeat mammogram already  She is also requesting refill of triamcinolone which she uses for eczema of flexural sides of arms and her hands  Fall Risk  02/28/2018 02/14/2018  Falls in the past year? No No     Depression screen [facility] 2/9 02/28/2018 02/14/2018  Decreased Interest 0 0  Down, Depressed, Hopeless 0 0  PHQ - 2 Score 0 0    Allergies  Allergen Reactions  . Other Other (See Comments)    ALL antibiotics cause yeast infections    Prior to Admission medications   Medication Sig Start Date End Date Taking? Authorizing Provider  amLODipine (NORVASC) 10 MG tablet Take 1 tablet (10 mg total) by mouth daily. 02/14/18  Yes Myles Lipps, MD  lisinopril (PRINIVIL,ZESTRIL) 20 MG tablet Take 1 tablet (20 mg total) by mouth daily. 02/28/18  Yes Myles Lipps, MD    Past Medical History:  Diagnosis Date  . Acid reflux   . Constipation   . Diabetes mellitus without complication (HCC)    Gestational   .  Hypertension   . Pre-eclampsia     Past Surgical History:  Procedure Laterality Date  . CESAREAN SECTION      Social History   Tobacco Use  . Smoking status: Current Every Day Smoker    Packs/day: 2.00    Types: Cigarettes  . Smokeless tobacco: Never Used  Substance Use Topics  . Alcohol use: Yes    Alcohol/week: 21.0 standard drinks    Types: 21 Cans of beer per week    Comment: 3 beers a day    Family History  Problem Relation Age of Onset  . Alcohol abuse Mother   . Hypertension Father   . Hypertension Sister   . Hypertension Brother   . Healthy Daughter   . Breast cancer Neg Hx     Review of Systems  Constitutional: Negative for chills and fever.  Respiratory: Negative for cough and shortness of breath.   Cardiovascular: Negative for chest pain, palpitations and leg swelling.  Gastrointestinal: Negative for abdominal pain, nausea and vomiting.  Musculoskeletal: Positive for joint pain and myalgias. Negative for falls.     OBJECTIVE:  Blood pressure 116/73, pulse 70, temperature 98.3 F (36.8 C), temperature source Oral, resp. rate 18, height 5\' 1"  (1.549 m), weight 101 lb (45.8 kg), last menstrual period 01/26/2016, SpO2 98 %. Body mass  index is 19.08 kg/m.   Physical Exam  Constitutional: She is oriented to person, place, and time. She appears well-developed and well-nourished.  HENT:  Head: Normocephalic and atraumatic.  Mouth/Throat: Oropharynx is clear and moist. No oropharyngeal exudate.  Eyes: Pupils are equal, round, and reactive to light. EOM are normal. No scleral icterus.  Neck: Neck supple.  Cardiovascular: Normal rate, regular rhythm and normal heart sounds. Exam reveals no gallop and no friction rub.  No murmur heard. Pulmonary/Chest: Effort normal and breath sounds normal. She has no wheezes. She has no rales.  Musculoskeletal: She exhibits no edema.  Lumbar back FROM normal, no tenderness Right hip normal ROM, no tenderness Quadriceps +  spams   Neurological: She is alert and oriented to person, place, and time.  Skin: Skin is warm and dry.  Nursing note and vitals reviewed.   ASSESSMENT and PLAN  1. Essential hypertension, benign Controlled. Continue current regime. Labs as ordered. - Comprehensive metabolic panel - Lipid panel - lisinopril (PRINIVIL,ZESTRIL) 20 MG tablet; Take 1 tablet (20 mg total) by mouth daily. - amLODipine (NORVASC) 10 MG tablet; Take 1 tablet (10 mg total) by mouth daily.  2. Tobacco use disorder Smoking cessation instruction/counseling given for 3-10 minutes:  counseled patient on the dangers of tobacco use, advised patient to stop smoking, and reviewed strategies to maximize success   3. Flexural eczema Controlled. Continue current regime.  - triamcinolone cream (KENALOG) 0.1 %; Apply 1 application topically 2 (two) times daily.  4. Right leg pain Seems to be secondary to quad spasms/strain. Discussed supportive measures, new meds r/se/b and RTC precautions. Patient educational handout given.  Other orders - varenicline (CHANTIX STARTING MONTH PAK) 0.5 MG X 11 & 1 MG X 42 tablet; Take one 0.5 mg tablet by mouth once daily for 3 days, then increase to one 0.5 mg tablet twice daily for 4 days, then increase to one 1 mg tablet twice daily. - varenicline (CHANTIX) 1 MG tablet; Take 1 tablet (1 mg total) by mouth 2 (two) times daily. - meloxicam (MOBIC) 7.5 MG tablet; Take 1 tablet (7.5 mg total) by mouth daily. - cyclobenzaprine (FLEXERIL) 5 MG tablet; Take 1 tablet (5 mg total) by mouth 3 (three) times daily as needed for muscle spasms.    Return in about 2 months (around 10/01/2018).    [name], MD [facility] at [facility] [address] [place], [place] [address] Ph.  [phone] Fax [phone]

## 2018-08-01 NOTE — Patient Instructions (Addendum)
If you have lab work done today you will be contacted with your lab results within the next 2 weeks.  If you have not heard from [place] then please contact us. The fastest way to get your results is to register for My Chart.   IF you received an x-ray today, you will receive an invoice from [facility]. Please contact [facility] at [phone] with questions or concerns regarding your invoice.   IF you received labwork today, you will receive an invoice from [place]. Please contact [facility] at [phone] with questions or concerns regarding your invoice.   Our billing staff will not be able to assist you with questions regarding bills from these companies.  You will be contacted with the lab results as soon as they are available. The fastest way to get your results is to activate your My Chart account. Instructions are located on the last page of this paperwork. If you have not heard from [place] regarding the results in 2 weeks, please contact this office.      Quadriceps Strain Rehab Ask your health care provider which exercises are safe for you. Do exercises exactly as told by your health care provider and adjust them as directed. It is normal to feel mild stretching, pulling, tightness, or discomfort as you do these exercises, but you should stop right away if you feel sudden pain or your pain gets worse.Do not begin these exercises until told by your health care provider. Stretching and range of motion exercises These exercises warm up your muscles and joints and improve the movement and flexibility of your thigh. These exercises can also help to relieve stiffness or swelling. Exercise A: Heel slides  1. Lie on your back with both knees straight. If this causes back discomfort, bend the knee of your healthy leg, placing your foot flat on the floor. 2. Slowly slide your left / right heel back toward your buttocks until you feel a gentle stretch in the front of your  knee or thigh. 3. Hold for __________ seconds. Then slowly slide your heel back to the starting position. Repeat __________ times. Complete this exercise __________ times a day. Exercise B: Quadriceps stretch, prone  1. Lie on your abdomen on a firm surface, such as a bed or padded floor. 2. Bend your left / right knee and hold your ankle. If you cannot reach your ankle or pant leg, loop a belt around your foot and grab the belt instead. 3. Gently pull your heel toward your buttocks. Your knee should not slide out to the side. You should feel a stretch in the front of your thigh and knee. 4. Hold this position for __________ seconds. Repeat __________ times. Complete this exercise __________ times a day. Strengthening exercises These exercises build strength and endurance in your thigh. Endurance is the ability to use your muscles for a long time, even after your muscles get tired. Exercise C: Straight leg raises ( quadriceps and hip flexors) Quality counts! Watch for signs that the quadriceps muscle is working to ensure that you are strengthening the correct muscles and not cheating by using healthier muscles. 1. Lie on your back with your left / right leg extended and your other knee bent. 2. Tense the muscles in the front of your left / right thigh. You should see your kneecap slide up or see increased dimpling just above the knee. 3. Tighten these muscles even more and raise your leg 4-6 inches (10-15 cm) off the  floor. 4. Hold for __________ seconds. 5. Keep the thigh muscles tense as you lower your leg. 6. Relax the muscles slowly and completely after each repetition. Repeat __________ times. Complete this exercise __________ times a day. Exercise D: Straight leg raises ( hip extensors) 1. Lie on your belly on a bed or a firm surface with a pillow under your hips. 2. Bend your left / right knee so your foot is straight up in the air. 3. Tense your buttock muscles and lift your left /  right thigh off the bed. Do not let your back arch. 4. Hold this position for __________ seconds. 5. Slowly return to the starting position. Let your muscles relax completely before doing another repetition. Repeat __________ times. Complete this exercise __________ times a day. Exercise E: Wall sits  Follow the directions for form closely. If you do not place your feet and knees properly, this can lead to knee pain. 1. Lean back against a smooth wall or door and walk your feet out 18-24 inches (46-61 cm) from it. Place your feet hip-width apart. 2. Slowly slide down the wall or door until your knees bend __________ degrees. Keep your weight back and over your heels, not over your toes. Keep your thighs straight or pointing slightly outward. 3. Hold for __________ seconds. 4. Use your thigh and buttock muscles to push you back up to a standing position. Keep your weight through your heels while you do this. 5. Rest for __________ seconds in between repetitions. Repeat __________ times. Complete this exercise __________ times a day. This information is not intended to replace advice given to you by your health care provider. Make sure you discuss any questions you have with your health care provider. Document Released: 11/27/2005 Document Revised: 08/03/2016 Document Reviewed: 08/31/2015 Elsevier Interactive Patient Education  [employer]2018 [employer]

## 2018-08-29 ENCOUNTER — Other Ambulatory Visit: Payer: Self-pay | Admitting: Family Medicine

## 2018-08-29 DIAGNOSIS — L2082 Flexural eczema: Secondary | ICD-10-CM

## 2018-08-29 NOTE — Telephone Encounter (Signed)
Refill of flexeril and Kenalog cream  Flexeril LRF 08/01/18  #30  1 refill  Kenalog LRF 08/01/18  30 G  0 refills  LOV 08/01/18 Dr. Leretha PolSantiago  [facility] [place] - [id number]

## 2018-09-13 ENCOUNTER — Other Ambulatory Visit: Payer: BLUE CROSS/BLUE SHIELD

## 2018-10-18 ENCOUNTER — Ambulatory Visit: Payer: BLUE CROSS/BLUE SHIELD | Admitting: Family Medicine

## 2019-03-20 ENCOUNTER — Telehealth (INDEPENDENT_AMBULATORY_CARE_PROVIDER_SITE_OTHER): Payer: BLUE CROSS/BLUE SHIELD | Admitting: Family Medicine

## 2019-03-20 ENCOUNTER — Other Ambulatory Visit: Payer: Self-pay

## 2019-03-20 ENCOUNTER — Other Ambulatory Visit: Payer: Self-pay | Admitting: Family Medicine

## 2019-03-20 DIAGNOSIS — M79604 Pain in right leg: Secondary | ICD-10-CM

## 2019-03-20 DIAGNOSIS — I10 Essential (primary) hypertension: Secondary | ICD-10-CM

## 2019-03-20 MED ORDER — IBUPROFEN 600 MG PO TABS: 600.0000 mg | ORAL_TABLET | Freq: Three times a day (TID) | ORAL | 0 refills | Status: DC | PRN

## 2019-03-20 MED ORDER — LISINOPRIL 20 MG PO TABS: 20.0000 mg | ORAL_TABLET | Freq: Every day | ORAL | 0 refills | Status: DC

## 2019-03-20 MED ORDER — AMLODIPINE BESYLATE 10 MG PO TABS: 10.0000 mg | ORAL_TABLET | Freq: Every day | ORAL | 0 refills | Status: DC

## 2019-03-20 MED ORDER — GABAPENTIN 300 MG PO CAPS: 300.0000 mg | ORAL_CAPSULE | Freq: Three times a day (TID) | ORAL | 3 refills | Status: DC

## 2019-03-20 NOTE — Progress Notes (Signed)
Virtual Visit via telephone Note  I connected with patient on 03/20/19 at 313pm by telephone and verified that I am speaking with the correct person using two identifiers. Janice Singleton is currently located at home and patient is currently with her during visit. The provider, [name], MD is located in their office at time of visit.  I discussed the limitations, risks, security and privacy concerns of performing an evaluation and management service by telephone and the availability of in person appointments. I also discussed with the patient that there may be a patient responsible charge related to this service. The patient expressed understanding and agreed to proceed.   Chief Complaint: X 2 weeks  Lower back pain that travels down her right leg. B/P med refill Norvasc and Lisinopril   HPI 2 weeks ago her right leg started to hurt again, previous episode in 12 Jul 2018 Mild pain in her back and radiates towards the front of her thigh, "gets locked up" and cant move it. Sharp pain Fell yesterday at work Numbness this morning No changes to bladder or bowel function Denies any trauma, inciting event Denies any fever or chills Has never seen a specialiist Has been taking ibu and flexeril has not been helping?  Fall Risk  03/20/2019 03/20/2019 02/28/2018 02/14/2018  Falls in the past year? 0 1 No No  Number falls in past yr: 0 0 - -  Injury with Fall? 0 0 - -  Follow up Falls evaluation completed - - -     [facility] 2/9 03/20/2019 03/20/2019 02/28/2018  Decreased Interest 0 0 0  Down, Depressed, Hopeless 0 0 0  PHQ - 2 Score 0 0 0    Allergies  Allergen Reactions  . Other Other (See Comments)    ALL antibiotics cause yeast infections    Prior to Admission medications   Medication Sig Start Date End Date Taking? Authorizing Provider  amLODipine (NORVASC) 10 MG tablet Take 1 tablet (10 mg total) by mouth daily. 08/01/18  Yes Myles LippsSantiago, Irma M, MD  cyclobenzaprine  (FLEXERIL) 5 MG tablet Take 1 tablet by mouth 3 times daily as needed for muscle spasms 08/30/18  Yes Myles LippsSantiago, Irma M, MD  lisinopril (PRINIVIL,ZESTRIL) 20 MG tablet Take 1 tablet (20 mg total) by mouth daily. 08/01/18  Yes Myles LippsSantiago, Irma M, MD  meloxicam (MOBIC) 7.5 MG tablet Take 1 tablet (7.5 mg total) by mouth daily. 08/01/18  Yes Myles LippsSantiago, Irma M, MD  triamcinolone cream (KENALOG) 0.1 % Apply 1 application to affected area 2 times daily 08/30/18  Yes Myles LippsSantiago, Irma M, MD  varenicline (CHANTIX STARTING MONTH PAK) 0.5 MG X 11 & 1 MG X 42 tablet Take one 0.5 mg tablet by mouth once daily for 3 days, then increase to one 0.5 mg tablet twice daily for 4 days, then increase to one 1 mg tablet twice daily. Patient not taking: Reported on 03/20/2019 08/01/18   Myles LippsSantiago, Irma M, MD  varenicline (CHANTIX) 1 MG tablet Take 1 tablet (1 mg total) by mouth 2 (two) times daily. Patient not taking: Reported on 03/20/2019 09/02/18   Myles LippsSantiago, Irma M, MD    Past Medical History:  Diagnosis Date  . Acid reflux   . Constipation   . Diabetes mellitus without complication (HCC)    Gestational   . Hypertension   . Pre-eclampsia     Past Surgical History:  Procedure Laterality Date  . CESAREAN SECTION      Social History  Tobacco Use  . Smoking status: Current Every Day Smoker    Packs/day: 2.00    Types: Cigarettes  . Smokeless tobacco: Never Used  Substance Use Topics  . Alcohol use: Yes    Alcohol/week: 21.0 standard drinks    Types: 21 Cans of beer per week    Comment: 3 beers a day    Family History  Problem Relation Age of Onset  . Alcohol abuse Mother   . Hypertension Father   . Hypertension Sister   . Hypertension Brother   . Healthy Daughter   . Breast cancer Neg Hx     ROS Per hpi  Objective  Vitals as reported by the patient: none  There were no vitals filed for this visit.  ASSESSMENT and PLAN  1. Right leg pain Discussed supportive measures, new meds r/se/b and RTC  precautions. Re-eval in person in 2 weeks.  2. Essential hypertension, benign Medications refilled - amLODipine (NORVASC) 10 MG tablet; Take 1 tablet (10 mg total) by mouth daily. - lisinopril (PRINIVIL,ZESTRIL) 20 MG tablet; Take 1 tablet (20 mg total) by mouth daily.  Other orders - gabapentin (NEURONTIN) 300 MG capsule; Take 1 capsule (300 mg total) by mouth 3 (three) times daily. - ibuprofen (ADVIL,MOTRIN) 600 MG tablet; Take 1 tablet (600 mg total) by mouth every 8 (eight) hours as needed.  FOLLOW-UP: 2 weeks in office (afternoon)   The above assessment and management plan was discussed with the patient. The patient verbalized understanding of and has agreed to the management plan. Patient is aware to call the clinic if symptoms persist or worsen. Patient is aware when to return to the clinic for a follow-up visit. Patient educated on when it is appropriate to go to the emergency department.    I provided 13 minutes of non-face-to-face time during this encounter.  Myles Lipps, MD Primary Care at [facility] [address] [place], [place] [address] Ph.  [phone] Fax [phone]

## 2019-03-20 NOTE — Progress Notes (Signed)
Chief Complaint: X 2 weeks  Lower back pain that travels down her right leg. B/P med refill Norvasc and Lisinopril

## 2019-04-04 ENCOUNTER — Encounter: Payer: Self-pay | Admitting: Family Medicine

## 2019-04-04 ENCOUNTER — Ambulatory Visit: Payer: BLUE CROSS/BLUE SHIELD | Admitting: Family Medicine

## 2019-04-04 ENCOUNTER — Other Ambulatory Visit: Payer: Self-pay

## 2019-04-04 VITALS — BP 149/79 | HR 89 | Temp 97.2°F | Ht 63.5 in | Wt 107.4 lb

## 2019-04-04 DIAGNOSIS — S76111A Strain of right quadriceps muscle, fascia and tendon, initial encounter: Secondary | ICD-10-CM

## 2019-04-04 DIAGNOSIS — M79651 Pain in right thigh: Secondary | ICD-10-CM

## 2019-04-04 MED ORDER — BACLOFEN 10 MG PO TABS: 10.0000 mg | ORAL_TABLET | Freq: Three times a day (TID) | ORAL | 0 refills | Status: DC

## 2019-04-04 MED ORDER — METHYLPREDNISOLONE 4 MG PO TBPK: ORAL_TABLET | ORAL | 0 refills | Status: DC

## 2019-04-04 MED ORDER — TRAMADOL HCL 50 MG PO TABS: 50.0000 mg | ORAL_TABLET | Freq: Three times a day (TID) | ORAL | 0 refills | Status: AC | PRN

## 2019-04-04 NOTE — Progress Notes (Signed)
4/24/202011:45 AM  Janice Singleton 02/10/1966, 53 y.o., female [id number]  Chief Complaint  Patient presents with  . right leg pain  . Fall    weekend of easter    HPI:   Patient is a 53 y.o. female with past medical history significant for HTN who presents today for followup on RLE pain  Evaluated via virtual visit on 03/20/2019 RLE pain started after fall, leg looked up on her rx gabapentin, ibu and flexeril - nothing has been helping Right low back, radiates down her leg, thigh hurts the most Burning pain No weakness but very painful to walk or move     Fall Risk  04/04/2019 03/20/2019 03/20/2019 02/28/2018 02/14/2018  Falls in the past year? 1 0 1 No No  Number falls in past yr: 0 0 0 - -  Injury with Fall? 1 0 0 - -  Risk for fall due to : Impaired mobility - - - -  Follow up Falls evaluation completed Falls evaluation completed - - -     [facility] 04/04/2019 03/20/2019 03/20/2019  Decreased Interest 0 0 0  Down, Depressed, Hopeless 0 0 0  PHQ - 2 Score 0 0 0    Allergies  Allergen Reactions  . Other Other (See Comments)    ALL antibiotics cause yeast infections    Prior to Admission medications   Medication Sig Start Date End Date Taking? Authorizing Provider  amLODipine (NORVASC) 10 MG tablet Take 1 tablet (10 mg total) by mouth daily. 03/20/19  Yes Myles LippsSantiago, Mosiah Bastin M, MD  cyclobenzaprine (FLEXERIL) 5 MG tablet Take 1 tablet by mouth 3 times daily as needed for muscle spasms 08/30/18  Yes Myles LippsSantiago, Virgia Kelner M, MD  gabapentin (NEURONTIN) 300 MG capsule Take 1 capsule (300 mg total) by mouth 3 (three) times daily. 03/20/19  Yes Myles LippsSantiago, Corinthia Helmers M, MD  ibuprofen (ADVIL,MOTRIN) 600 MG tablet Take 1 tablet (600 mg total) by mouth every 8 (eight) hours as needed. 03/20/19  Yes Myles LippsSantiago, Baleria Wyman M, MD  lisinopril (PRINIVIL,ZESTRIL) 20 MG tablet Take 1 tablet (20 mg total) by mouth daily. 03/20/19  Yes Myles LippsSantiago, Ameet Sandy M, MD  meloxicam (MOBIC) 7.5 MG tablet Take 1 tablet (7.5 mg  total) by mouth daily. 08/01/18  Yes Myles LippsSantiago, Temeka Pore M, MD  triamcinolone cream (KENALOG) 0.1 % Apply 1 application to affected area 2 times daily 08/30/18  Yes Myles LippsSantiago, Anyae Griffith M, MD  varenicline (CHANTIX STARTING MONTH PAK) 0.5 MG X 11 & 1 MG X 42 tablet Take one 0.5 mg tablet by mouth once daily for 3 days, then increase to one 0.5 mg tablet twice daily for 4 days, then increase to one 1 mg tablet twice daily. Patient not taking: Reported on 04/04/2019 08/01/18   Myles LippsSantiago, Dagan Heinz M, MD  varenicline (CHANTIX) 1 MG tablet Take 1 tablet (1 mg total) by mouth 2 (two) times daily. Patient not taking: Reported on 04/04/2019 09/02/18   Myles LippsSantiago, Annalyn Blecher M, MD    Past Medical History:  Diagnosis Date  . Acid reflux   . Constipation   . Diabetes mellitus without complication (HCC)    Gestational   . Hypertension   . Pre-eclampsia     Past Surgical History:  Procedure Laterality Date  . CESAREAN SECTION      Social History   Tobacco Use  . Smoking status: Current Every Day Smoker    Packs/day: 2.00    Types: Cigarettes  . Smokeless tobacco: Never Used  Substance Use Topics  .  Alcohol use: Yes    Alcohol/week: 21.0 standard drinks    Types: 21 Cans of beer per week    Comment: 3 beers a day    Family History  Problem Relation Age of Onset  . Alcohol abuse Mother   . Hypertension Father   . Hypertension Sister   . Hypertension Brother   . Healthy Daughter   . Breast cancer Neg Hx     ROS Per hpi  OBJECTIVE:  Today's Vitals   04/04/19 1053  BP: (!) 149/79  Pulse: 89  Temp: (!) 97.2 F (36.2 C)  TempSrc: Oral  SpO2: 97%  Weight: 107 lb 6.4 oz (48.7 kg)  Height: 5' 3.5" (1.613 m)   Body mass index is 18.73 kg/m.   Physical Exam  Gen: AAOx3, in obvious pain, supporting her RLE, tiptoeing to walk MSK: no spinal tenderness, mild right paraspinal spams Right hip: TTP along anterior hip, GT and IT Band, FROM but painful due to knee flexion, lateral quad spasm present, normal  reflexes, distally NVI.    ASSESSMENT and PLAN  1. Acute pain of right thigh Discussed supportive measures, new meds r/se/b and RTC precautions. Referring to sports medicine strains vs tear?? - Ambulatory referral to Sports Medicine  2. Strain of right quadriceps, initial encounter - Ambulatory referral to Sports Medicine  Other orders - methylPREDNISolone (MEDROL) 4 MG TBPK tablet; Follow package instructions - traMADol (ULTRAM) 50 MG tablet; Take 1 tablet (50 mg total) by mouth every 8 (eight) hours as needed for up to 5 days. - baclofen (LIORESAL) 10 MG tablet; Take 1 tablet (10 mg total) by mouth 3 (three) times daily.  Return in about 4 weeks (around 05/02/2019).    Myles Lipps, MD Primary Care at [facility] [address] [place], [place] [address] Ph.  [phone] Fax [phone]

## 2019-04-04 NOTE — Patient Instructions (Signed)
° ° ° °  If you have lab work done today you will be contacted with your lab results within the next 2 weeks.  If you have not heard from us then please contact us. The fastest way to get your results is to register for My Chart. ° ° °IF you received an x-ray today, you will receive an invoice from [facility]. Please contact [facility] at [phone] with questions or concerns regarding your invoice.  ° °IF you received labwork today, you will receive an invoice from [facility]. Please contact [facility] at [phone] with questions or concerns regarding your invoice.  ° °Our billing staff will not be able to assist you with questions regarding bills from these companies. ° °You will be contacted with the lab results as soon as they are available. The fastest way to get your results is to activate your My Chart account. Instructions are located on the last page of this paperwork. If you have not heard from us regarding the results in 2 weeks, please contact this office. °  ° ° ° °

## 2019-04-17 ENCOUNTER — Ambulatory Visit
Admission: RE | Admit: 2019-04-17 | Discharge: 2019-04-17 | Disposition: A | Discharge status: Home / Self Care | Payer: BLUE CROSS/BLUE SHIELD | Source: Ambulatory Visit | Attending: Sports Medicine | Admitting: Sports Medicine

## 2019-04-17 ENCOUNTER — Ambulatory Visit: Payer: BLUE CROSS/BLUE SHIELD | Admitting: Sports Medicine

## 2019-04-17 ENCOUNTER — Other Ambulatory Visit: Payer: Self-pay

## 2019-04-17 ENCOUNTER — Encounter: Payer: Self-pay | Admitting: Sports Medicine

## 2019-04-17 VITALS — BP 143/84 | Ht 61.0 in | Wt 107.0 lb

## 2019-04-17 DIAGNOSIS — M25551 Pain in right hip: Secondary | ICD-10-CM | POA: Diagnosis not present

## 2019-04-17 MED ORDER — METHYLPREDNISOLONE ACETATE 40 MG/ML IJ SUSP
40.0000 mg | Freq: Once | INTRAMUSCULAR | Status: AC
  Administered 2019-04-17: 40 mg via INTRA_ARTICULAR

## 2019-04-17 NOTE — Progress Notes (Signed)
Janice Singleton - 53 y.o. female MRN [id number]  Date of birth: Apr 02, 1966    SUBJECTIVE:      Chief Complaint: Right leg pain  HPI:  Patient is a 53 year old female who presents with approximately 1 month of pain in the right hip pain anterior thigh.  She denies any specific injury that caused her pain.  On review of previous notes, there is mention of a fall.  Patient states that her pain was present prior to the fall and was a cause of the fall, not a result of the fall.  She reports a constant 10/10 pain in the anterior lateral hip and the proximal quadricep area.  Her pain does radiate to the knee as well as into the groin.  It is made worse with any movement, walking, or standing from a seated position.  She has been prescribed gabapentin, Flexeril, ibuprofen, steroid Dosepak without notable benefit.  She denies any localized swelling, bruising, erythema.  No associated numbness or tingling.  No associated skin changes   ROS:     See HPI. All other reviewed systems negative.  PERTINENT  PMH / PSH FH / / SH:  Past Medical, Surgical, Social, and Family History Reviewed & Updated in the EMR.    OBJECTIVE: BP (!) 143/84   Ht 5\' 1"  (1.549 m)   Wt 107 lb (48.5 kg)   LMP 01/26/2016 (Approximate)   BMI 20.22 kg/m   Physical Exam:  Vital signs are reviewed.  GEN: Alert and oriented, NAD Pulm: Breathing unlabored PSY: normal mood, congruent affect  MSK: Right hip:  - Inspection: No gross deformity, no swelling, erythema, or ecchymosis - Palpation: Diffuse tenderness over the anterior lateral hip and proximal quadricep - ROM: Patient is normal range of motion passive flexion.  She does have fairly significant decreased passive internal rotation associate with pain.  She also slightly decreased external rotation with pain - Strength: 4/5 strength in the hip in flexion and abduction - Neuro/vasc: NV intact distally - Special Tests: Pain with FABER and FADIR.  Positive logroll.    Left  hip: No obvious deformity No tenderness to palpation Full passive range of motion in internal/external rotation without pain 5/5 strength N/V intact distally  Lumbar exam: No gross deformity No tenderness to palpation 2+ DTRs in lower extremity bilaterally Sensation intact to light touch in bilateral lower extremities   ASSESSMENT & PLAN:  1.  Right hip pain- based on exam the pain is highly suspicious for osteoarthritis.  Differential also includes lumbar radiculopathy however suspect this is less likely. - We will obtain x-rays of the right hip and femur -Provided with crutches to decrease weightbearing -For pain control recommend continued ibuprofen and Tylenol as needed.  Discussed treatment with tramadol which she states she previously took and was not helpful.  Also considered other opioids, however patient states that constipation is already a significant issue for her. -She will return to the clinic after these are performed and will consider steroid injection   Addendum: Patient obtain x-rays today and return to clinic.  X-rays were independently reviewed and showed no acute structural bony abnormalities.  Perhaps only very mild arthritis. Patient was offered steroid injection which she elected to proceed.  Injection performed as described below. She will follow up in 1 week. If not improvement will order MRI   Procedure performed: hip intraarticular corticosteroid injection; ultrasound guided, in-plane approach Consent obtained and verified. Time-out conducted. Noted no overlying erythema, induration, or other signs of local  infection. The Right hip joint was identified with ultrasound using. The overlying skin was prepped in a sterile fashion. Topical analgesic spray: Ethyl chloride. Joint: Right hip joint Needle: 22GA, 3.5" Completed without difficulty. Meds: Depo-Medrol 40 mg, lidocaine 4 cc   Patient seen and evaluated with the sports medicine fellow.  I agree  with the above plan of care.  X-rays today do not show any obvious fracture and the patient was having pain prior to falling.  We will go ahead and inject her hip today with cortisone and she will follow-up in 1 week.  If cortisone injection is ineffective, I will strongly consider merits of further diagnostic imaging.

## 2019-04-20 IMAGING — MG DIGITAL DIAGNOSTIC BILATERAL MAMMOGRAM WITH TOMO AND CAD
8 series · 8 of 24 positions shown · non-contrast
Comparison: None.

CLINICAL DATA: Patient presents with a palpable abnormality along
the lateral right breast. She is not feeling this today. No prior
available mammograms.

EXAM:
DIGITAL DIAGNOSTIC BILATERAL MAMMOGRAM WITH CAD AND TOMO
ULTRASOUND RIGHT BREAST

[L CC synth-2D]
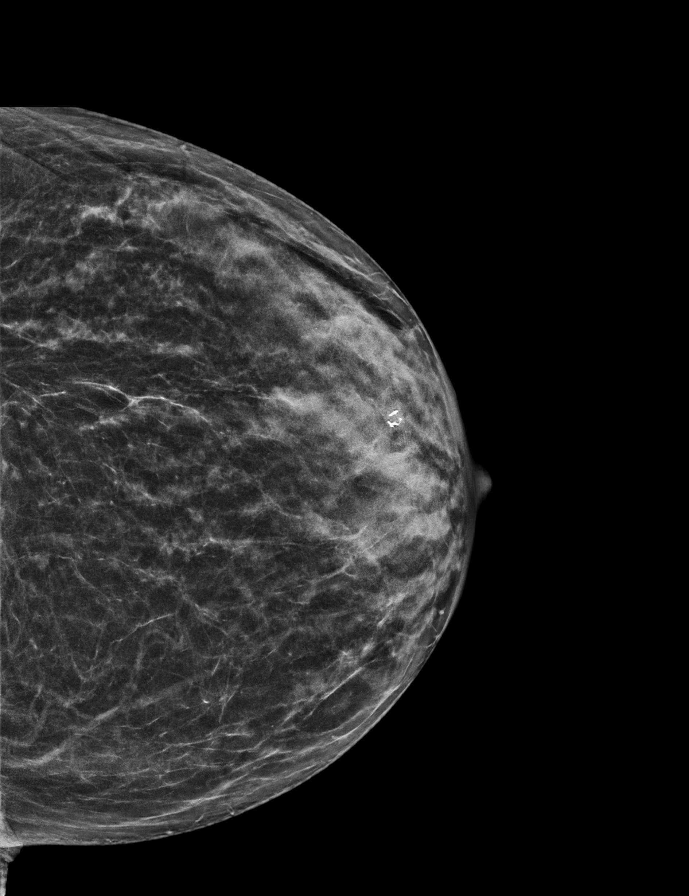

[R MLO synth-2D]
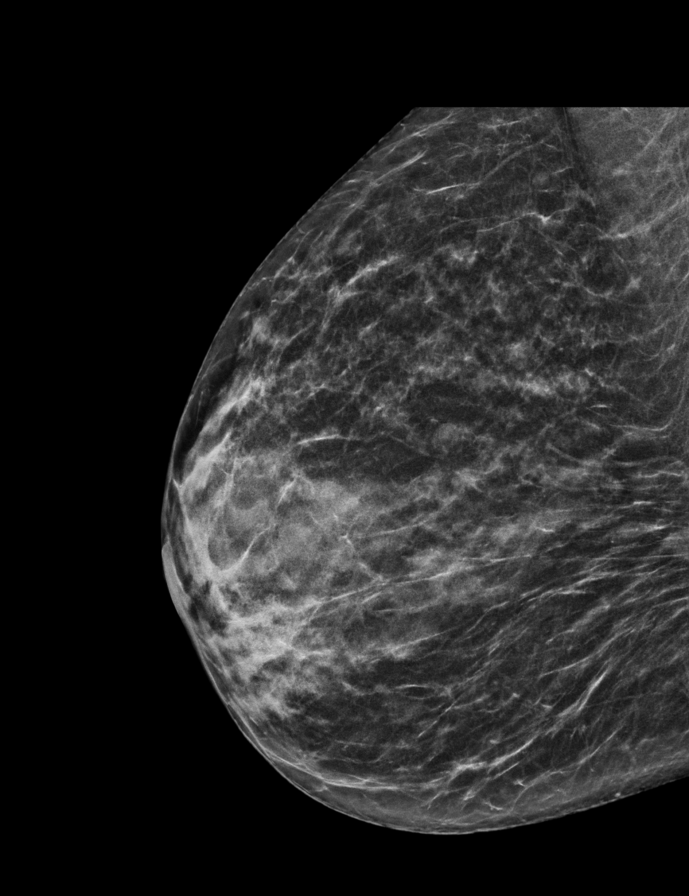

[L MLO synth-2D]
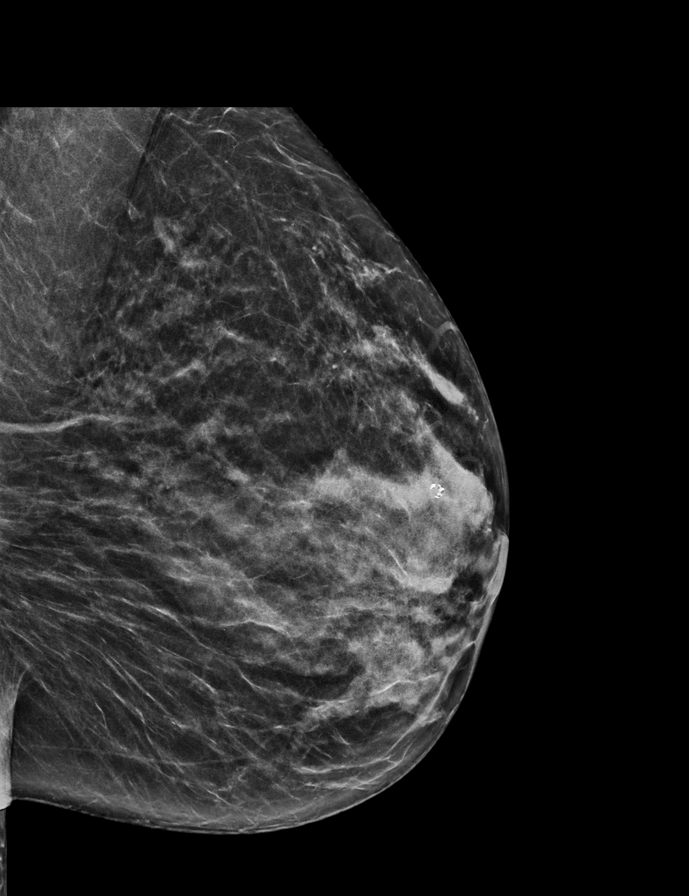

[R CC synth-2D]
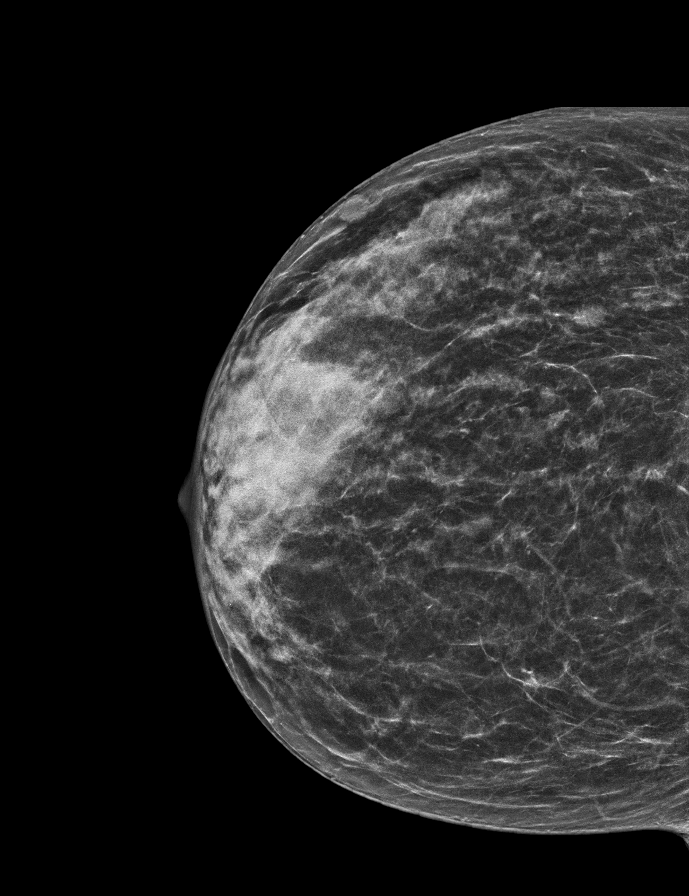

[R CC tomo · tomo slice 25/48.0]
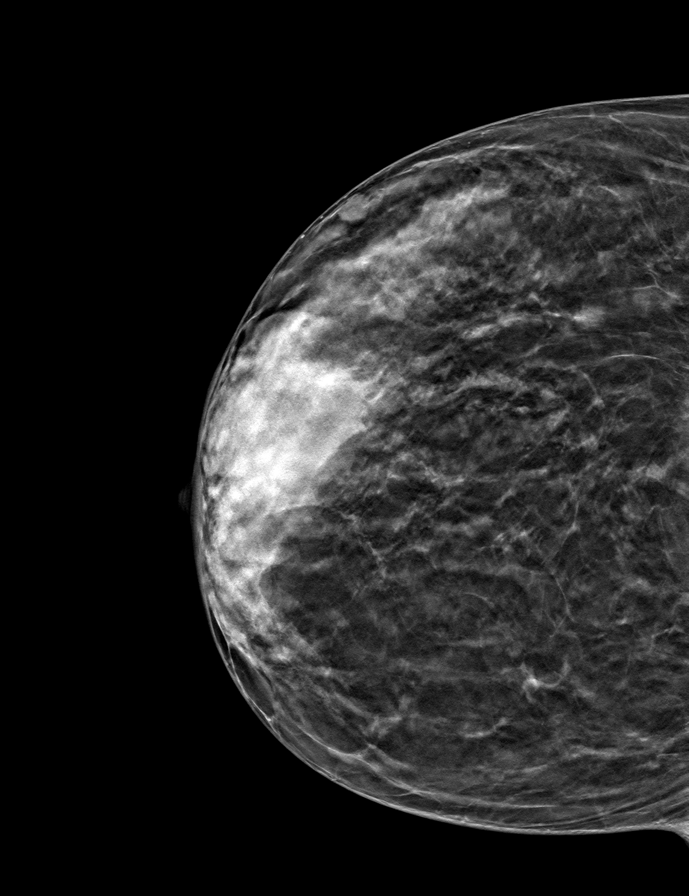

[R MLO tomo · tomo slice 25/48.0]
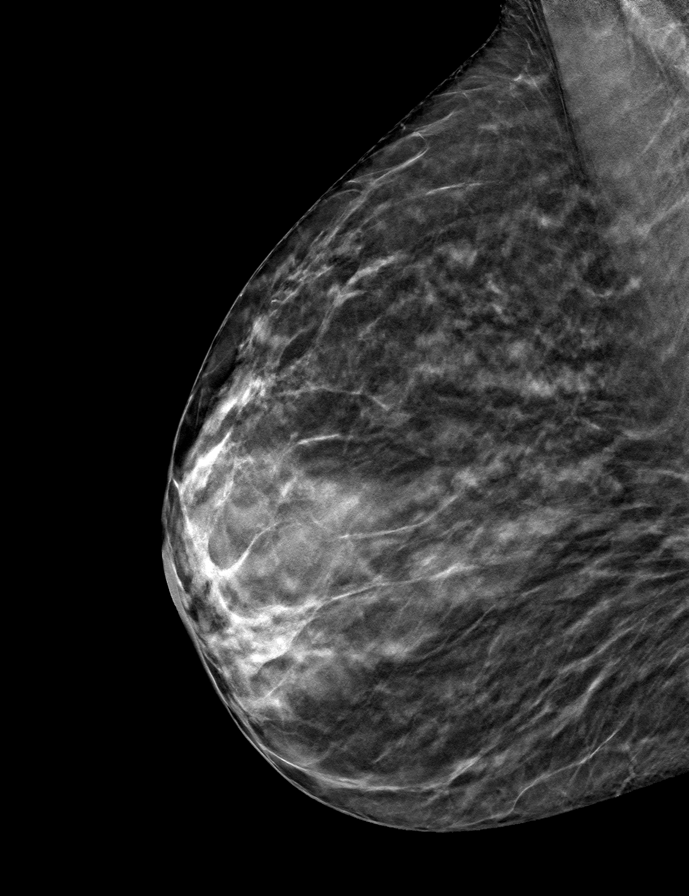

[L MLO tomo · tomo slice 26/51.0]
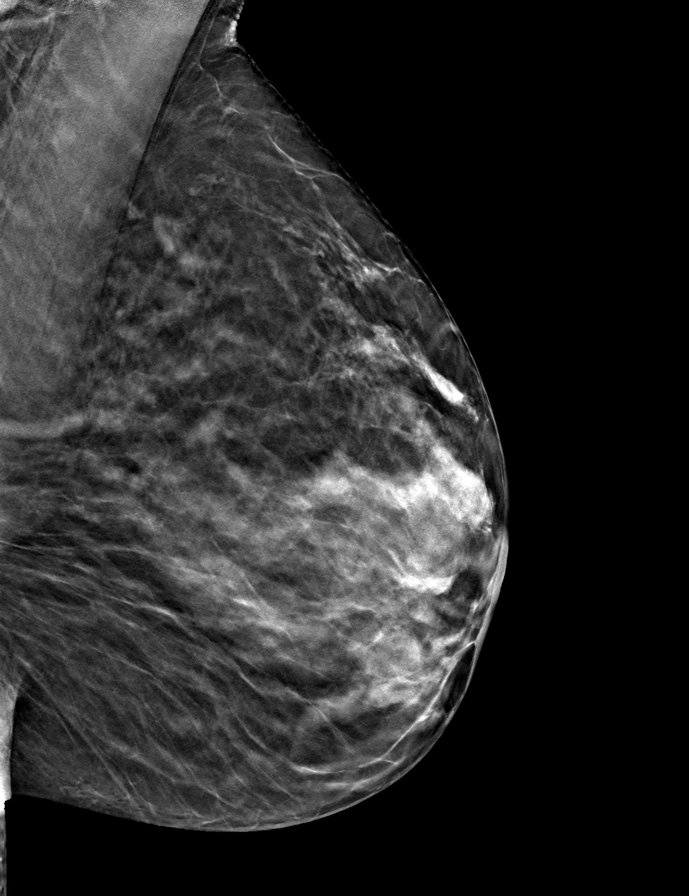

[L CC tomo · tomo slice 27/52.0]
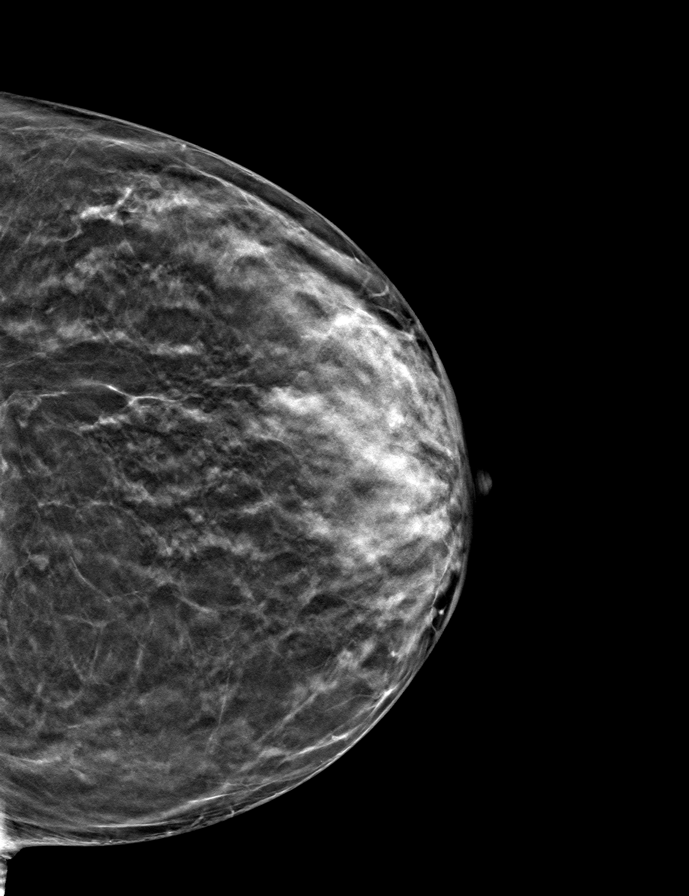

[8 of 24 positions shown; findings below may reference images not displayed]

ACR Breast Density Category c: The breast tissue is heterogeneously
dense, which may obscure small masses.
FINDINGS: In the superficial, lateral aspect of the right breast, there is an
oval circumscribed mass measuring approximately 1 cm in size. There
is an additional oval mass in the posterior upper outer quadrant. No
other right breast masses, no areas of architectural distortion and
no suspicious calcifications.

On the left, there is a small group of benign dystrophic
calcifications in the anterior upper outer quadrant. There are no
discrete masses, areas of architectural distortion or suspicious
calcifications.

Mammographic images were processed with CAD.

On physical exam, there is a small superficial mobile palpable mass
in the 9:30 o'clock position of the right breast. No other palpable
masses.

Targeted right breast ultrasound is performed, showing an oval,
circumscribed, hypoechoic superficial mass in the right breast at
the 9:30 o'clock position, 5 cm the nipple, corresponding to the
palpable abnormality, measuring 8 x 5 x 8 mm. In the 11 o'clock
position, 7 cm from the nipple, posterior depth, there is an oval
hypoechoic circumscribed mass measuring 8 x 3 x 7 mm, likely
representing the more posterior upper outer quadrant mass seen
mammographically.
IMPRESSION: 1. Two probably benign masses, most likely fibroadenomas, in the
right breast upper outer quadrant, 1 at the 9:30 o'clock position
and the other posteriorly at 11 o'clock. Short-term follow-up is
recommended.

RECOMMENDATION:
Diagnostic right breast mammography and ultrasound in 6 months.

I have discussed the findings and recommendations with the patient.
Results were also provided in writing at the conclusion of the
visit. If applicable, a reminder letter will be sent to the patient
regarding the next appointment.

BI-RADS CATEGORY  3: Probably benign.

## 2019-04-21 NOTE — Addendum Note (Signed)
Addended by: Annita Brod on: 04/21/2019 08:50 AM   Modules accepted: Orders

## 2019-04-22 ENCOUNTER — Telehealth: Payer: Self-pay | Admitting: Family Medicine

## 2019-04-22 NOTE — Telephone Encounter (Signed)
Called patient and told her to go ahead with the MRI. This will expedite the treatment plan once the injection wears off and the pain reappears.   Pt voiced agreement

## 2019-04-22 NOTE — Telephone Encounter (Signed)
Patient is doing fine now with walking and may want to cancel her appt here and also her MRI.  Please call and let her know if she should keep any of these appts.  Best # [phone]

## 2019-04-24 ENCOUNTER — Other Ambulatory Visit: Payer: Self-pay | Admitting: *Deleted

## 2019-04-24 ENCOUNTER — Ambulatory Visit: Payer: BLUE CROSS/BLUE SHIELD | Admitting: Sports Medicine

## 2019-04-24 ENCOUNTER — Other Ambulatory Visit: Payer: BLUE CROSS/BLUE SHIELD

## 2019-04-24 ENCOUNTER — Other Ambulatory Visit: Payer: Self-pay | Admitting: Sports Medicine

## 2019-04-24 MED ORDER — ACETAMINOPHEN-CODEINE #3 300-30 MG PO TABS: ORAL_TABLET | ORAL | 0 refills | Status: DC

## 2019-04-24 NOTE — Progress Notes (Signed)
Patient called stating injection has worn off and wants tylenol #3 for pain.  Per Janice Singleton pt can have this request if she also takes a stool softener.  Pt agreed

## 2019-05-07 ENCOUNTER — Ambulatory Visit: Payer: BLUE CROSS/BLUE SHIELD | Admitting: Family Medicine

## 2019-05-15 ENCOUNTER — Other Ambulatory Visit: Payer: Self-pay

## 2019-05-15 ENCOUNTER — Ambulatory Visit
Admission: RE | Admit: 2019-05-15 | Discharge: 2019-05-15 | Disposition: A | Discharge status: Home / Self Care | Payer: BLUE CROSS/BLUE SHIELD | Source: Ambulatory Visit | Attending: Sports Medicine | Admitting: Sports Medicine

## 2019-05-15 DIAGNOSIS — M25551 Pain in right hip: Secondary | ICD-10-CM | POA: Diagnosis not present

## 2019-05-19 ENCOUNTER — Other Ambulatory Visit: Payer: Self-pay

## 2019-05-19 ENCOUNTER — Telehealth: Payer: Self-pay | Admitting: *Deleted

## 2019-05-19 ENCOUNTER — Encounter: Payer: Self-pay | Admitting: Sports Medicine

## 2019-05-19 ENCOUNTER — Ambulatory Visit (INDEPENDENT_AMBULATORY_CARE_PROVIDER_SITE_OTHER): Payer: BLUE CROSS/BLUE SHIELD | Admitting: Sports Medicine

## 2019-05-19 VITALS — Ht 62.0 in | Wt 104.0 lb

## 2019-05-19 DIAGNOSIS — M84350D Stress fracture, pelvis, subsequent encounter for fracture with routine healing: Secondary | ICD-10-CM | POA: Diagnosis not present

## 2019-05-19 NOTE — Telephone Encounter (Signed)
Letter written and faxed.

## 2019-05-19 NOTE — Progress Notes (Signed)
  This visit was provided via WebEx Consent obtained Patient located at home Provider located in his office  I followed up with the patient today via WebEx to discuss MRI findings of her right hip.  We proceeded with MRI after she received no initial improvement after a diagnostic intra-articular right hip cortisone injection.  The MRI shows evidence of a stress fracture of the right pubo-acetabular junction.  There is quite a bit of edema within the acetabulum.  Although the patient does have crutches it sounds like she is still weightbearing.  Her job requires her to stand all day long.  She is still experiencing severe hip pain.  I recommended that she be completely nonweightbearing on her crutches and that she strongly insist on a sitting type job at work until she has healed.  I would like to see her again in the office in 6 weeks for reevaluation.  I will plan on getting plain x-rays of the hip at that time.

## 2019-06-20 ENCOUNTER — Other Ambulatory Visit: Payer: Self-pay | Admitting: Family Medicine

## 2019-06-30 ENCOUNTER — Encounter: Payer: Self-pay | Admitting: Sports Medicine

## 2019-06-30 ENCOUNTER — Ambulatory Visit: Payer: BLUE CROSS/BLUE SHIELD | Admitting: Sports Medicine

## 2019-06-30 ENCOUNTER — Other Ambulatory Visit: Payer: Self-pay

## 2019-06-30 VITALS — BP 124/70 | Ht 62.0 in | Wt 104.0 lb

## 2019-06-30 DIAGNOSIS — M84350D Stress fracture, pelvis, subsequent encounter for fracture with routine healing: Secondary | ICD-10-CM | POA: Diagnosis not present

## 2019-06-30 NOTE — Progress Notes (Signed)
   Subjective:    Patient ID: Janice Singleton, female    DOB: [birth year], 53 y.o.   MRN: [id number]  HPI   Janice Singleton comes in today for follow-up on a stress fracture in her right hip.  She is doing much better.  She comes in today off of her crutches.  She denies any pain.  She has been out of work for the past 6 weeks.  She is anxious to return.   Review of Systems    As above Objective:   Physical Exam  Well-developed, well-nourished.  No acute distress.  Awake alert and oriented x3.  Vital signs reviewed  Right hip: Smooth painless hip range of motion with a negative logroll.  There is no tenderness to palpation.  Good strength.  Negative hop test.  Neurovascularly intact distally.      Assessment & Plan:   Clinically improved right hip stress fracture  Although it has only been 6 weeks since her diagnosis, she is doing much better.  She denies any pain with walking.  She has a negative hop test today in the office.  She is anxious to return to work so I have cleared her to return tomorrow without restrictions.  However, I did caution her that if her pain starts to return then she will need to sit out a little more time at work.  She understands.  Follow-up as needed.

## 2019-07-14 ENCOUNTER — Other Ambulatory Visit: Payer: Self-pay | Admitting: Family Medicine

## 2019-07-14 DIAGNOSIS — I10 Essential (primary) hypertension: Secondary | ICD-10-CM

## 2019-07-21 ENCOUNTER — Telehealth: Payer: Self-pay | Admitting: Sports Medicine

## 2019-07-21 NOTE — Telephone Encounter (Signed)
Pt calling to request refill of: Pain Medication stated that tylenol did not work. Best phone # to contact is [phone].  Pharmacy:  [facility] [place]

## 2019-10-21 ENCOUNTER — Other Ambulatory Visit: Payer: Self-pay | Admitting: Family Medicine

## 2019-10-21 NOTE — Telephone Encounter (Signed)
Requested medication (s) are due for refill today: no  Requested medication (s) are on the active medication list:no  Last refill:  06/20/2019  Future visit scheduled: no  Notes to clinic: medication was discontinued   Requested Prescriptions  Pending Prescriptions Disp Refills   gabapentin (NEURONTIN) 300 MG capsule [Pharmacy Med Name: gabapentin 300 mg capsule] 90 capsule 3    Sig: TAKE 1 CAPSULE BY MOUTH 3 TIMES DAILY     Neurology: Anticonvulsants - gabapentin Passed - 10/21/2019  8:43 AM      Passed - Valid encounter within last 12 months    Recent Outpatient Visits          6 months ago Acute pain of right thigh   [facility], Lilia Argue, MD   7 months ago Right leg pain   [facility], Lilia Argue, MD   1 year ago Essential hypertension, benign   [facility], Lilia Argue, MD   1 year ago Essential hypertension, benign   [facility], Lilia Argue, MD   1 year ago Uterine mass   [facility], Lilia Argue, MD

## 2019-10-23 ENCOUNTER — Other Ambulatory Visit: Payer: Self-pay

## 2019-10-23 ENCOUNTER — Telehealth (INDEPENDENT_AMBULATORY_CARE_PROVIDER_SITE_OTHER): Payer: BC Managed Care – PPO | Admitting: Family Medicine

## 2019-10-23 DIAGNOSIS — R202 Paresthesia of skin: Secondary | ICD-10-CM

## 2019-10-23 DIAGNOSIS — M79652 Pain in left thigh: Secondary | ICD-10-CM | POA: Diagnosis not present

## 2019-10-23 MED ORDER — GABAPENTIN 300 MG PO CAPS: 300.0000 mg | ORAL_CAPSULE | Freq: Every day | ORAL | 3 refills | Status: DC

## 2019-10-23 NOTE — Progress Notes (Signed)
Virtual Visit Note  I connected with patient on 10/23/19 at 611pm by phone (unable to connect via doxyme)  and verified that I am speaking with the correct person using two identifiers. Janice Singleton is currently located at home and patient is currently with them during visit. The provider, Rutherford Guys, MD is located in their office at time of visit.  I discussed the limitations, risks, security and privacy concerns of performing an evaluation and management service by telephone and the availability of in person appointments. I also discussed with the patient that there may be a patient responsible charge related to this service. The patient expressed understanding and agreed to proceed.   CC: leg pain x 1 month  HPI ? Left thigh has become stiff and painful during the day, starts after standing for long period of time, no hip or knee pain, no swelling, no cramps or spasms, no weakness At night both of them become numb and tingling in her calves, walking around does help Topical OTC meds help very little occ has low back muscle spasms, no regular pain No loss of bowel or bladder Takes BC powder daily helps  Allergies  Allergen Reactions  . Other Other (See Comments)    ALL antibiotics cause yeast infections    Prior to Admission medications   Medication Sig Start Date End Date Taking? Authorizing Provider  amLODipine (NORVASC) 10 MG tablet TAKE 1 TABLET BY MOUTH EVERY DAY 07/14/19  Yes Rutherford Guys, MD  lisinopril (ZESTRIL) 20 MG tablet TAKE 1 TABLET BY MOUTH EVERY DAY 07/14/19  Yes Rutherford Guys, MD    Past Medical History:  Diagnosis Date  . Acid reflux   . Constipation   . Diabetes mellitus without complication ([place])    Gestational   . Hypertension   . Pre-eclampsia     Past Surgical History:  Procedure Laterality Date  . CESAREAN SECTION      Social History   Tobacco Use  . Smoking status: Current Every Day Smoker    Packs/day: 2.00    Types:  Cigarettes  . Smokeless tobacco: Never Used  Substance Use Topics  . Alcohol use: Yes    Alcohol/week: 21.0 standard drinks    Types: 21 Cans of beer per week    Comment: 3 beers a day    Family History  Problem Relation Age of Onset  . Alcohol abuse Mother   . Hypertension Father   . Hypertension Sister   . Hypertension Brother   . Healthy Daughter   . Breast cancer Neg Hx     ROS Per hpi  Objective  Vitals as reported by the patient: none   ASSESSMENT and PLAN  1. Left thigh pain Unclear etiology, no red flag sx. Trial of APAP arthritis.   2. Paresthesia of both lower extremities RLS? Trial of gabapentin at bedtime, reviewed r/se/b, can increase to 600mg  at bedtime if needed.   Other orders - gabapentin (NEURONTIN) 300 MG capsule; Take 1 capsule (300 mg total) by mouth at bedtime.  FOLLOW-UP: 2-4 weeks   The above assessment and management plan was discussed with the patient. The patient verbalized understanding of and has agreed to the management plan. Patient is aware to call the clinic if symptoms persist or worsen. Patient is aware when to return to the clinic for a follow-up visit. Patient educated on when it is appropriate to go to the emergency department.    I provided 12 minutes of non-face-to-face  time during this encounter.  Rutherford Guys, MD [facility] [place], [place] [address] Ph.  [phone] Fax [phone]

## 2019-10-23 NOTE — Progress Notes (Signed)
Pt is having leg pain, says she stands for about 8hrs a day for work. Taking bc powder daily which is concerning to her. Pain gets really bad while trying to sleep. Lots of stiffness and "funny tinglin feeling" per pt. Walking is somewhat difficult, denies falling. Says she did have bruised pelvis, however that has healed. No anxiety or depression.

## 2019-11-20 ENCOUNTER — Other Ambulatory Visit: Payer: Self-pay | Admitting: Family Medicine

## 2019-11-20 DIAGNOSIS — I10 Essential (primary) hypertension: Secondary | ICD-10-CM

## 2020-02-18 ENCOUNTER — Other Ambulatory Visit: Payer: Self-pay | Admitting: Family Medicine

## 2020-02-18 DIAGNOSIS — I10 Essential (primary) hypertension: Secondary | ICD-10-CM

## 2020-02-18 MED ORDER — LISINOPRIL 20 MG PO TABS: 20.0000 mg | ORAL_TABLET | Freq: Every day | ORAL | 0 refills | Status: DC

## 2020-02-18 MED ORDER — AMLODIPINE BESYLATE 10 MG PO TABS: 10.0000 mg | ORAL_TABLET | Freq: Every day | ORAL | 0 refills | Status: DC

## 2020-02-18 NOTE — Telephone Encounter (Signed)
Faxes received from pt pharmacy , [facility] , requesting lisinopril 20 mg and amlodipine 10 mg. Refilled per protocol.

## 2020-05-17 ENCOUNTER — Other Ambulatory Visit: Payer: Self-pay | Admitting: Family Medicine

## 2020-05-17 DIAGNOSIS — I10 Essential (primary) hypertension: Secondary | ICD-10-CM

## 2020-05-17 MED ORDER — LISINOPRIL 20 MG PO TABS: 20.0000 mg | ORAL_TABLET | Freq: Every day | ORAL | 0 refills | Status: DC

## 2020-05-17 MED ORDER — AMLODIPINE BESYLATE 10 MG PO TABS: 10.0000 mg | ORAL_TABLET | Freq: Every day | ORAL | 0 refills | Status: DC

## 2020-09-06 ENCOUNTER — Other Ambulatory Visit: Payer: Self-pay

## 2020-09-06 ENCOUNTER — Other Ambulatory Visit: Payer: Self-pay | Admitting: Family Medicine

## 2020-09-06 ENCOUNTER — Ambulatory Visit
Admission: RE | Admit: 2020-09-06 | Discharge: 2020-09-06 | Disposition: A | Discharge status: Home / Self Care | Payer: BC Managed Care – PPO | Source: Ambulatory Visit | Attending: Family Medicine | Admitting: Family Medicine

## 2020-09-06 DIAGNOSIS — Z1231 Encounter for screening mammogram for malignant neoplasm of breast: Secondary | ICD-10-CM

## 2020-09-07 ENCOUNTER — Other Ambulatory Visit: Payer: Self-pay | Admitting: Family Medicine

## 2020-09-07 DIAGNOSIS — N63 Unspecified lump in unspecified breast: Secondary | ICD-10-CM

## 2020-09-20 ENCOUNTER — Other Ambulatory Visit: Payer: Self-pay | Admitting: Family Medicine

## 2020-09-20 NOTE — Telephone Encounter (Signed)
Requested Prescriptions  Pending Prescriptions Disp Refills   gabapentin (NEURONTIN) 300 MG capsule [Pharmacy Med Name: gabapentin 300 mg capsule] 90 capsule 0    Sig: TAKE 1 CAPSULE BY MOUTH AT BEDTIME     Neurology: Anticonvulsants - gabapentin Passed - 09/20/2020  2:05 PM      Passed - Valid encounter within last 12 months    Recent Outpatient Visits          11 months ago Left thigh pain   [facility], Meda Coffee, MD   1 year ago Acute pain of right thigh   [facility] Meda Coffee, MD   1 year ago Right leg pain   [facility], Meda Coffee, MD   2 years ago Essential hypertension, benign   [facility], Meda Coffee, MD   2 years ago Essential hypertension, benign   [facility], Meda Coffee, MD

## 2020-09-27 ENCOUNTER — Ambulatory Visit
Admission: RE | Admit: 2020-09-27 | Discharge: 2020-09-27 | Disposition: A | Discharge status: Home / Self Care | Payer: BC Managed Care – PPO | Source: Ambulatory Visit | Attending: Family Medicine | Admitting: Family Medicine

## 2020-09-27 ENCOUNTER — Other Ambulatory Visit: Payer: Self-pay

## 2020-09-27 DIAGNOSIS — N63 Unspecified lump in unspecified breast: Secondary | ICD-10-CM

## 2020-09-27 DIAGNOSIS — R922 Inconclusive mammogram: Secondary | ICD-10-CM | POA: Diagnosis not present

## 2020-09-27 DIAGNOSIS — N6311 Unspecified lump in the right breast, upper outer quadrant: Secondary | ICD-10-CM | POA: Diagnosis not present

## 2020-12-09 ENCOUNTER — Encounter: Payer: Self-pay | Admitting: Family Medicine

## 2020-12-09 ENCOUNTER — Ambulatory Visit: Payer: BC Managed Care – PPO | Admitting: Family Medicine

## 2020-12-09 ENCOUNTER — Other Ambulatory Visit: Payer: Self-pay

## 2020-12-09 VITALS — BP 138/82 | HR 76 | Temp 98.0°F | Ht 62.0 in | Wt 111.0 lb

## 2020-12-09 DIAGNOSIS — L299 Pruritus, unspecified: Secondary | ICD-10-CM

## 2020-12-09 DIAGNOSIS — Z13220 Encounter for screening for lipoid disorders: Secondary | ICD-10-CM

## 2020-12-09 DIAGNOSIS — M79604 Pain in right leg: Secondary | ICD-10-CM

## 2020-12-09 DIAGNOSIS — Z131 Encounter for screening for diabetes mellitus: Secondary | ICD-10-CM | POA: Diagnosis not present

## 2020-12-09 DIAGNOSIS — I10 Essential (primary) hypertension: Secondary | ICD-10-CM

## 2020-12-09 DIAGNOSIS — N644 Mastodynia: Secondary | ICD-10-CM | POA: Diagnosis not present

## 2020-12-09 DIAGNOSIS — R202 Paresthesia of skin: Secondary | ICD-10-CM

## 2020-12-09 DIAGNOSIS — R300 Dysuria: Secondary | ICD-10-CM

## 2020-12-09 LAB — POCT URINALYSIS DIP (MANUAL ENTRY)
Bilirubin, UA: NEGATIVE
Glucose, UA: NEGATIVE mg/dL
Ketones, POC UA: NEGATIVE mg/dL
Leukocytes, UA: NEGATIVE
Nitrite, UA: NEGATIVE
Protein Ur, POC: NEGATIVE mg/dL
Spec Grav, UA: 1.025 (ref 1.010–1.025)
Urobilinogen, UA: 0.2 E.U./dL
pH, UA: 7 (ref 5.0–8.0)

## 2020-12-09 MED ORDER — LORATADINE 10 MG PO TABS: 10.0000 mg | ORAL_TABLET | Freq: Every day | ORAL | 11 refills | Status: AC

## 2020-12-09 MED ORDER — LISINOPRIL 30 MG PO TABS
30.0000 mg | ORAL_TABLET | Freq: Every day | ORAL | 3 refills | Status: DC
Start: 2020-12-09 — End: 2022-06-07

## 2020-12-09 MED ORDER — TRIAMCINOLONE ACETONIDE 0.5 % EX OINT: 1.0000 "application " | TOPICAL_OINTMENT | Freq: Two times a day (BID) | CUTANEOUS | 3 refills | Status: DC

## 2020-12-09 MED ORDER — GABAPENTIN 300 MG PO CAPS
600.0000 mg | ORAL_CAPSULE | Freq: Every day | ORAL | 4 refills | Status: DC
Start: 2020-12-09 — End: 2022-06-08

## 2020-12-09 MED ORDER — AMLODIPINE BESYLATE 10 MG PO TABS: 10.0000 mg | ORAL_TABLET | Freq: Every day | ORAL | 0 refills | Status: DC

## 2020-12-09 NOTE — Patient Instructions (Addendum)
Breast Tenderness Breast tenderness is a common problem for women of all ages, but may also occur in men. Breast tenderness may range from mild discomfort to severe pain. In women, the pain usually comes and goes with the menstrual cycle, but it can also be constant. Breast tenderness has many possible causes, including hormone changes, infections, and taking certain medicines. You may have tests, such as a mammogram or an ultrasound, to check for any unusual findings. Having breast tenderness usually does not mean that you have breast cancer. Follow these instructions at home: Managing pain and discomfort   If directed, put ice to the painful area. To do this: ? Put ice in a plastic bag. ? Place a towel between your skin and the bag. ? Leave the ice on for 20 minutes, 2-3 times a day.  Wear a supportive bra, especially during exercise. You may also want to wear a supportive bra while sleeping if your breasts are very tender. Medicines  Take over-the-counter and prescription medicines only as told by your health care provider. If the cause of your pain is infection, you may be prescribed an antibiotic medicine.  If you were prescribed an antibiotic, take it as told by your health care provider. Do not stop taking the antibiotic even if you start to feel better. Eating and drinking  Your health care provider may recommend that you lessen the amount of fat in your diet. You can do this by: ? Limiting fried foods. ? Cooking foods using methods such as baking, boiling, grilling, and broiling.  Decrease the amount of caffeine in your diet. Instead, drink more water and choose caffeine-free drinks. General instructions   Keep a log of the days and times when your breasts are most tender.  Ask your health care provider how to do breast exams at home. This will help you notice if you have an unusual growth or lump.  Keep all follow-up visits as told by your health care provider. This is  important. Contact a health care provider if:  Any part of your breast is hard, red, and hot to the touch. This may be a sign of infection.  You are a woman and: ? Not breastfeeding and you have fluid, especially blood or pus, coming out of your nipples. ? Have a new or painful lump in your breast that remains after your menstrual period ends.  You have a fever.  Your pain does not improve or it gets worse.  Your pain is interfering with your daily activities. Summary  Breast tenderness may range from mild discomfort to severe pain.  Breast tenderness has many possible causes, including hormone changes, infections, and taking certain medicines.  It can be treated with ice, wearing a supportive bra, and medicines.  Make changes to your diet if told to by your health care provider. This information is not intended to replace advice given to you by your health care provider. Make sure you discuss any questions you have with your health care provider. Document Revised: 04/21/2019 Document Reviewed: 04/21/2019 Elsevier Patient Education  2020 [employer]  Atopic Dermatitis Atopic dermatitis is a skin disorder that causes inflammation of the skin. This is the most common type of eczema. Eczema is a group of skin conditions that cause the skin to be itchy, red, and swollen. This condition is generally worse during the cooler winter months and often improves during the warm summer months. Symptoms can vary from person to person. Atopic dermatitis usually starts showing  signs in infancy and can last through adulthood. This condition cannot be passed from one person to another (non-contagious), but it is more common in families. Atopic dermatitis may not always be present. When it is present, it is called a flare-up. What are the causes? The exact cause of this condition is not known. Flare-ups of the condition may be triggered by:  Contact with something that you are sensitive or allergic  to.  Stress.  Certain foods.  Extremely hot or cold weather.  Harsh chemicals and soaps.  Dry air.  Chlorine. What increases the risk? This condition is more likely to develop in people who have a personal history or family history of eczema, allergies, asthma, or hay fever. What are the signs or symptoms? Symptoms of this condition include:  Dry, scaly skin.  Red, itchy rash.  Itchiness, which can be severe. This may occur before the skin rash. This can make sleeping difficult.  Skin thickening and cracking that can occur over time. How is this diagnosed? This condition is diagnosed based on your symptoms, a medical history, and a physical exam. How is this treated? There is no cure for this condition, but symptoms can usually be controlled. Treatment focuses on:  Controlling the itchiness and scratching. You may be given medicines, such as antihistamines or steroid creams.  Limiting exposure to things that you are sensitive or allergic to (allergens).  Recognizing situations that cause stress and developing a plan to manage stress. If your atopic dermatitis does not get better with medicines, or if it is all over your body (widespread), a treatment using a specific type of light (phototherapy) may be used. Follow these instructions at home: Skin care   Keep your skin well-moisturized. Doing this seals in moisture and helps to prevent dryness. ? Use unscented lotions that have petroleum in them. ? Avoid lotions that contain alcohol or water. They can dry the skin.  Keep baths or showers short (less than 5 minutes) in warm water. Do not use hot water. ? Use mild, unscented cleansers for bathing. Avoid soap and bubble bath. ? Apply a moisturizer to your skin right after a bath or shower.  Do not apply anything to your skin without checking with your health care provider. General instructions  Dress in clothes made of cotton or cotton blends. Dress lightly because  heat increases itchiness.  When washing your clothes, rinse your clothes twice so all of the soap is removed.  Avoid any triggers that can cause a flare-up.  Try to manage your stress.  Keep your fingernails cut short.  Avoid scratching. Scratching makes the rash and itchiness worse. It may also result in a skin infection (impetigo) due to a break in the skin caused by scratching.  Take or apply over-the-counter and prescription medicines only as told by your health care provider.  Keep all follow-up visits as told by your health care provider. This is important.  Do not be around people who have cold sores or fever blisters. If you get the infection, it may cause your atopic dermatitis to worsen. Contact a health care provider if:  Your itchiness interferes with sleep.  Your rash gets worse or it is not better within one week of starting treatment.  You have a fever.  You have a rash flare-up after having contact with someone who has cold sores or fever blisters. Get help right away if:  You develop pus or soft yellow scabs in the rash area. Summary  This  condition causes a red rash and itchy, dry, scaly skin.  Treatment focuses on controlling the itchiness and scratching, limiting exposure to things that you are sensitive or allergic to (allergens), recognizing situations that cause stress, and developing a plan to manage stress.  Keep your skin well-moisturized.  Keep baths or showers shorter than 5 minutes and use warm water. Do not use hot water. This information is not intended to replace advice given to you by your health care provider. Make sure you discuss any questions you have with your health care provider. Document Revised: 03/18/2019 Document Reviewed: 12/29/2016 Elsevier Patient Education  [employer].    If you have lab work done today you will be contacted with your lab results within the next 2 weeks.  If you have not heard from [place] then please  contact us. The fastest way to get your results is to register for My Chart.   IF you received an x-ray today, you will receive an invoice from [facility]. Please contact [facility] at [phone] with questions or concerns regarding your invoice.   IF you received labwork today, you will receive an invoice from [facility]. Please contact [facility] at [phone] with questions or concerns regarding your invoice.   Our billing staff will not be able to assist you with questions regarding bills from these companies.  You will be contacted with the lab results as soon as they are available. The fastest way to get your results is to activate your My Chart account. Instructions are located on the last page of this paperwork. If you have not heard from [place] regarding the results in 2 weeks, please contact this office.

## 2020-12-09 NOTE — Progress Notes (Addendum)
12/30/202112:52 PM  Janice Singleton January 13, 1966, 54 y.o., female [id number]  Chief Complaint  Patient presents with  . burning with urination  . Left breast soreness    Noticed soreness and pus 3 days ago / mammogram 10/18    HPI:   Patient is a 54 y.o. female with past medical history significant for HTN who presents today for breast pain.  LMP 5 years ago  Hypertension Amlodipine 10 mg daily Lisinopril 20 mg daily  BP Readings from Last 3 Encounters:  12/09/20 138/82  06/30/19 124/70  04/17/19 (!) 143/84   Breast US and Mammorgram: 09/27/20: [facility]: Two benign right breast masses consistent with fibroadenomas, stable for over 2 years.  Left Breast Pain started Monday Noticed White pus from her nipple   Depression screen [facility] 2/9 12/09/2020 10/23/2019 04/04/2019  Decreased Interest 0 0 0  Down, Depressed, Hopeless 0 0 0  PHQ - 2 Score 0 0 0    Fall Risk  12/09/2020 10/23/2019 04/04/2019 03/20/2019 03/20/2019  Falls in the past year? 0 0 1 0 1  Number falls in past yr: 0 0 0 0 0  Injury with Fall? 0 0 1 0 0  Risk for fall due to : - - Impaired mobility - -  Follow up Falls evaluation completed - Falls evaluation completed Falls evaluation completed -     Allergies  Allergen Reactions  . Other Other (See Comments)    ALL antibiotics cause yeast infections    Prior to Admission medications   Medication Sig Start Date End Date Taking? Authorizing Provider  amLODipine (NORVASC) 10 MG tablet Take 1 tablet (10 mg total) by mouth daily. 05/17/20   Jacelyn Pi, Lilia Argue, MD  gabapentin (NEURONTIN) 300 MG capsule TAKE 1 CAPSULE BY MOUTH AT BEDTIME 09/20/20   Jacelyn Pi, Lilia Argue, MD  lisinopril (ZESTRIL) 20 MG tablet Take 1 tablet (20 mg total) by mouth daily. 05/17/20   Daleen Squibb, MD    Past Medical History:  Diagnosis Date  . Acid reflux   . Constipation   . Diabetes mellitus without complication ([place])    Gestational   . Hypertension   . Pre-eclampsia      Past Surgical History:  Procedure Laterality Date  . CESAREAN SECTION      Social History   Tobacco Use  . Smoking status: Current Every Day Smoker    Packs/day: 2.00    Types: Cigarettes  . Smokeless tobacco: Never Used  Substance Use Topics  . Alcohol use: Yes    Alcohol/week: 21.0 standard drinks    Types: 21 Cans of beer per week    Comment: 3 beers a day    Family History  Problem Relation Age of Onset  . Alcohol abuse Mother   . Hypertension Father   . Hypertension Sister   . Hypertension Brother   . Healthy Daughter   . Breast cancer Neg Hx     Review of Systems  Constitutional: Negative for chills, fever and malaise/fatigue.  Eyes: Negative for blurred vision and double vision.  Respiratory: Negative for cough, shortness of breath and wheezing.   Cardiovascular: Negative for chest pain, palpitations and leg swelling.  Gastrointestinal: Negative for abdominal pain, blood in stool, constipation, diarrhea, heartburn, nausea and vomiting.  Genitourinary: Negative for dysuria, frequency and hematuria.  Musculoskeletal: Negative for back pain and joint pain.       Right leg pain  Skin: Positive for itching. Negative for rash.  Neurological: Positive  for tingling. Negative for dizziness, weakness and headaches.     OBJECTIVE:  Today's Vitals   12/09/20 1121  BP: 138/82  Pulse: 76  Temp: 98 F (36.7 C)  SpO2: 95%  Weight: 111 lb (50.3 kg)  Height: 5' 2"  (1.575 m)   Body mass index is 20.3 kg/m.   Physical Exam Constitutional:      General: She is not in acute distress.    Appearance: Normal appearance. She is not ill-appearing.  HENT:     Head: Normocephalic.  Cardiovascular:     Rate and Rhythm: Normal rate and regular rhythm.     Pulses: Normal pulses.     Heart sounds: Normal heart sounds. No murmur heard. No friction rub. No gallop.   Pulmonary:     Effort: Pulmonary effort is normal. No respiratory distress.     Breath sounds: Normal  breath sounds. No stridor. No wheezing, rhonchi or rales.  Chest:  Breasts:     Right: Mass present. No inverted nipple, nipple discharge, skin change, axillary adenopathy or supraclavicular adenopathy.     Left: Tenderness present. No swelling, bleeding, inverted nipple, mass, nipple discharge, skin change, axillary adenopathy or supraclavicular adenopathy.    Abdominal:     General: Bowel sounds are normal.     Palpations: Abdomen is soft.     Tenderness: There is no abdominal tenderness.  Musculoskeletal:     Thoracic back: Normal.     Lumbar back: Normal.     Right lower leg: Normal. No edema.     Left lower leg: Normal. No edema.  Lymphadenopathy:     Upper Body:     Right upper body: No supraclavicular, axillary or pectoral adenopathy.     Left upper body: No supraclavicular, axillary or pectoral adenopathy.  Skin:    General: Skin is warm and dry.     Findings: No bruising, erythema, lesion or rash.  Neurological:     Mental Status: She is alert and oriented to person, place, and time.  Psychiatric:        Mood and Affect: Mood normal.        Behavior: Behavior normal.     Results for orders placed or performed in visit on 12/09/20 (from the past 24 hour(s))  POCT urinalysis dipstick     Status: Abnormal   Collection Time: 12/09/20 12:07 PM  Result Value Ref Range   Color, UA yellow yellow   Clarity, UA clear clear   Glucose, UA negative negative mg/dL   Bilirubin, UA negative negative   Ketones, POC UA negative negative mg/dL   Spec Grav, UA 1.025 1.010 - 1.025   Blood, UA trace-intact (A) negative   pH, UA 7.0 5.0 - 8.0   Protein Ur, POC negative negative mg/dL   Urobilinogen, UA 0.2 0.2 or 1.0 E.U./dL   Nitrite, UA Negative Negative   Leukocytes, UA Negative Negative    No results found.   ASSESSMENT and PLAN  Problem List Items Addressed This Visit   None   Visit Diagnoses    Burning with urination    -  Primary   Relevant Orders   POCT  urinalysis dipstick (Completed): trace blood   Urine Culture Will follow up with results   Breast pain, left       Relevant Orders   CBC with Differential   Prolactin   TSH   Vitamin D, 25-hydroxy  Stable mammogram and ultrasound in Oct 2021 Breast exam non-concerning findings Will follow up  with lab results    Screening cholesterol level       Relevant Orders   Lipid panel   Essential hypertension, benign       Relevant Medications   amLODipine (NORVASC) 10 MG tablet   lisinopril (ZESTRIL) 30 MG tablet  Increased Lisinopril from 69m to 355mdue to bp not at goal< 130/80 BP: 138/82     Other Relevant Orders   CMP14+EGFR   Screening for diabetes mellitus       Relevant Orders   Hemoglobin A1c   Paresthesia of both lower extremities       Right leg pain       Relevant Medications   gabapentin (NEURONTIN) 300 MG capsule  Due to lethargy from medication only takes qhs Will increase dose due to leg pain   Itching       Relevant Medications   triamcinolone ointment (KENALOG) 0.5 %   loratadine (CLARITIN) 10 MG tablet  Discussed treatment to take nightly Claritin Encouraged to use Vaseline based products daily for dryness Use steroid cream only as needed discussed r/se/b of medication       Return in about 3 months (around 03/09/2021).    KeHuston Foleyust, FNP-BC [id number].  [phone] have reviewed and agree with above documentation. MiAgustina CaroliMD

## 2020-12-10 LAB — CMP14+EGFR
ALT: 14 IU/L (ref 0–32)
AST: 18 IU/L (ref 0–40)
Albumin/Globulin Ratio: 2 (ref 1.2–2.2)
Albumin: 4.1 g/dL (ref 3.8–4.9)
Alkaline Phosphatase: 104 IU/L (ref 44–121)
BUN/Creatinine Ratio: 16 (ref 9–23)
BUN: 12 mg/dL (ref 6–24)
Bilirubin Total: 0.6 mg/dL (ref 0.0–1.2)
CO2: 21 mmol/L (ref 20–29)
Calcium: 9 mg/dL (ref 8.7–10.2)
Chloride: 105 mmol/L (ref 96–106)
Creatinine, Ser: 0.76 mg/dL (ref 0.57–1.00)
GFR calc Af Amer: 101 mL/min/{1.73_m2} (ref >59)
GFR calc non Af Amer: 88 mL/min/{1.73_m2} (ref >59)
Globulin, Total: 2.1 g/dL (ref 1.5–4.5)
Glucose: 96 mg/dL (ref 65–99)
Potassium: 4.8 mmol/L (ref 3.5–5.2)
Sodium: 141 mmol/L (ref 134–144)
Total Protein: 6.2 g/dL (ref 6.0–8.5)

## 2020-12-10 LAB — LIPID PANEL
Chol/HDL Ratio: 3.9 ratio (ref 0.0–4.4)
Cholesterol, Total: 238 mg/dL — ABNORMAL HIGH (ref 100–199)
HDL: 61 mg/dL (ref >39)
LDL Chol Calc (NIH): 159 mg/dL — ABNORMAL HIGH (ref 0–99)
Triglycerides: 105 mg/dL (ref 0–149)
VLDL Cholesterol Cal: 18 mg/dL (ref 5–40)

## 2020-12-10 LAB — PROLACTIN: Prolactin: 5 ng/mL (ref 4.8–23.3)

## 2020-12-10 LAB — CBC WITH DIFFERENTIAL/PLATELET
Basophils Absolute: 0.1 10*3/uL (ref 0.0–0.2)
Basos: 1 %
EOS (ABSOLUTE): 0.1 10*3/uL (ref 0.0–0.4)
Eos: 1 %
Hematocrit: 46.2 % (ref 34.0–46.6)
Hemoglobin: 16.2 g/dL — ABNORMAL HIGH (ref 11.1–15.9)
Immature Grans (Abs): 0 10*3/uL (ref 0.0–0.1)
Immature Granulocytes: 0 %
Lymphocytes Absolute: 2.6 10*3/uL (ref 0.7–3.1)
Lymphs: 38 %
MCH: 33.3 pg — ABNORMAL HIGH (ref 26.6–33.0)
MCHC: 35.1 g/dL (ref 31.5–35.7)
MCV: 95 fL (ref 79–97)
Monocytes Absolute: 0.5 10*3/uL (ref 0.1–0.9)
Monocytes: 7 %
Neutrophils Absolute: 3.5 10*3/uL (ref 1.4–7.0)
Neutrophils: 53 %
Platelets: 251 10*3/uL (ref 150–450)
RBC: 4.86 x10E6/uL (ref 3.77–5.28)
RDW: 13.1 % (ref 11.7–15.4)
WBC: 6.7 10*3/uL (ref 3.4–10.8)

## 2020-12-10 LAB — TSH: TSH: 1.14 u[IU]/mL (ref 0.450–4.500)

## 2020-12-10 LAB — HEMOGLOBIN A1C
Est. average glucose Bld gHb Est-mCnc: 120 mg/dL
Hgb A1c MFr Bld: 5.8 % — ABNORMAL HIGH (ref 4.8–5.6)

## 2020-12-10 LAB — VITAMIN D 25 HYDROXY (VIT D DEFICIENCY, FRACTURES): Vit D, 25-Hydroxy: 7.9 ng/mL — ABNORMAL LOW (ref 30.0–100.0)

## 2020-12-11 LAB — URINE CULTURE

## 2020-12-12 NOTE — Progress Notes (Signed)
If you could schedule her an appointment in the next week virtual or in person to discuss her lab results. Thanks!

## 2020-12-13 NOTE — Progress Notes (Signed)
Called pt and shc appt for 12/14/20

## 2020-12-14 ENCOUNTER — Other Ambulatory Visit: Payer: Self-pay

## 2020-12-14 ENCOUNTER — Encounter: Payer: Self-pay | Admitting: Family Medicine

## 2020-12-14 ENCOUNTER — Telehealth (INDEPENDENT_AMBULATORY_CARE_PROVIDER_SITE_OTHER): Payer: BC Managed Care – PPO | Admitting: Family Medicine

## 2020-12-14 VITALS — Ht 62.0 in | Wt 110.0 lb

## 2020-12-14 DIAGNOSIS — E7849 Other hyperlipidemia: Secondary | ICD-10-CM | POA: Diagnosis not present

## 2020-12-14 DIAGNOSIS — E559 Vitamin D deficiency, unspecified: Secondary | ICD-10-CM

## 2020-12-14 DIAGNOSIS — R7303 Prediabetes: Secondary | ICD-10-CM | POA: Diagnosis not present

## 2020-12-14 MED ORDER — VITAMIN D (ERGOCALCIFEROL) 1.25 MG (50000 UNIT) PO CAPS: 50000.0000 [IU] | ORAL_CAPSULE | ORAL | 6 refills | Status: DC

## 2020-12-14 MED ORDER — ROSUVASTATIN CALCIUM 10 MG PO TABS: 10.0000 mg | ORAL_TABLET | Freq: Every day | ORAL | 3 refills | Status: DC

## 2020-12-14 NOTE — Progress Notes (Addendum)
Virtual Visit Note  I connected with patient on 12/14/20 at 1442 by telephone due to unable to work Epic video visit and verified that I am speaking with the correct person using two identifiers. Janice Singleton is currently located at home and no family members are currently with them during visit. The provider, Azalee Course Hovanes Hymas, FNP is located in their office at time of visit.  I discussed the limitations, risks, security and privacy concerns of performing an evaluation and management service by telephone and the availability of in person appointments. I also discussed with the patient that there may be a patient responsible charge related to this service. The patient expressed understanding and agreed to proceed.   I provided 20 minutes of non-face-to-face time during this encounter.  Chief Complaint  Patient presents with  . Results    Follow up - Per patient to review labs because she does not understand results    HPI ? Breast pain is now sore, much improved  Itching has improved (continues to lotion and cream) Neuropathy on the feet continues Leg myalgias continue Urinary frequency continues: negative urine culture  Lab Results  Component Value Date   CHOL 238 (H) 12/09/2020   HDL 61 12/09/2020   LDLCALC 159 (H) 12/09/2020   TRIG 105 12/09/2020   CHOLHDL 3.9 12/09/2020   The 10-year ASCVD risk score Denman George DC Jr., et al., 2013) is: 13.9%   Values used to calculate the score:     Age: 55 years     Sex: Female     Is Non-Hispanic African American: Yes     Diabetic: No     Tobacco smoker: Yes     Systolic Blood Pressure: 138 mmHg     Is BP treated: Yes     HDL Cholesterol: 61 mg/dL     Total Cholesterol: 238 mg/dL Lab Results  Component Value Date   HGBA1C 5.8 (H) 12/09/2020   Last vitamin D Lab Results  Component Value Date   VD25OH 7.9 (L) 12/09/2020       Allergies  Allergen Reactions  . Other Other (See Comments)    ALL antibiotics cause yeast  infections    Prior to Admission medications   Medication Sig Start Date End Date Taking? Authorizing Provider  amLODipine (NORVASC) 10 MG tablet Take 1 tablet (10 mg total) by mouth daily. 12/09/20  Yes Brigett Estell, Azalee Course, FNP  gabapentin (NEURONTIN) 300 MG capsule Take 2 capsules (600 mg total) by mouth at bedtime. 12/09/20  Yes Glena Pharris, Azalee Course, FNP  lisinopril (ZESTRIL) 30 MG tablet Take 1 tablet (30 mg total) by mouth daily. 12/09/20  Yes Haruo Stepanek, Azalee Course, FNP  loratadine (CLARITIN) 10 MG tablet Take 1 tablet (10 mg total) by mouth daily. 12/09/20  Yes Elyon Zoll, Azalee Course, FNP  triamcinolone ointment (KENALOG) 0.5 % Apply 1 application topically 2 (two) times daily. 12/09/20  Yes Auren Valdes, Azalee Course, FNP    Past Medical History:  Diagnosis Date  . Acid reflux   . Constipation   . Diabetes mellitus without complication ([facility])    Gestational   . Hypertension   . Pre-eclampsia     Past Surgical History:  Procedure Laterality Date  . CESAREAN SECTION      Social History   Tobacco Use  . Smoking status: Current Every Day Smoker    Packs/day: 2.00    Types: Cigarettes  . Smokeless tobacco: Never Used  Substance Use Topics  . Alcohol use: Yes  Alcohol/week: 21.0 standard drinks    Types: 21 Cans of beer per week    Comment: 3 beers a day    Family History  Problem Relation Age of Onset  . Alcohol abuse Mother   . Hypertension Father   . Hypertension Sister   . Hypertension Brother   . Healthy Daughter   . Breast cancer Neg Hx     Review of Systems  Constitutional: Negative for chills, fever and malaise/fatigue.  Eyes: Negative for blurred vision and double vision.  Respiratory: Negative for cough, shortness of breath and wheezing.   Cardiovascular: Negative for chest pain, palpitations and leg swelling.  Gastrointestinal: Negative for abdominal pain, blood in stool, constipation, diarrhea, heartburn, nausea and vomiting.  Genitourinary: Positive for frequency. Negative for  dysuria and hematuria.  Musculoskeletal: Positive for myalgias. Negative for back pain and joint pain.  Skin: Negative for rash.  Neurological: Positive for tingling (Bilateral feet: unchanged). Negative for dizziness, weakness and headaches.    Objective  Constitutional:      General: She is not in acute distress.    Appearance: Normal appearance. She is not ill-appearing.   Pulmonary:     Effort: Pulmonary effort is normal. No respiratory distress.  Neurological:     Mental Status: She is alert and oriented to person, place, and time.  Psychiatric:        Mood and Affect: Mood normal.        Behavior: Behavior normal.     ASSESSMENT and PLAN  Problem List Items Addressed This Visit      Other   Other hyperlipidemia - Primary   Relevant Medications   rosuvastatin (CRESTOR) 10 MG tablet   Other Relevant Orders   Amb ref to Medical Nutrition Therapy-MNT   Vitamin D deficiency   Relevant Medications   Vitamin D, Ergocalciferol, (DRISDOL) 1.25 MG (50000 UNIT) CAPS capsule   Pre-diabetes   Relevant Orders   Amb ref to Medical Nutrition Therapy-MNT       Return if symptoms worsen or fail to improve, for next scheuled appointment.    The above assessment and management plan was discussed with the patient. The patient verbalized understanding of and has agreed to the management plan. Patient is aware to call the clinic if symptoms persist or worsen. Patient is aware when to return to the clinic for a follow-up visit. Patient educated on when it is appropriate to go to the emergency department.     Janice Carls Janice Confer, FNP-BC Primary Care at [facility] [address] [place], [place] [address] Ph.  [phone] Fax [phone]  I have reviewed and agree with above documentation. Edwina Barth, MD

## 2020-12-14 NOTE — Patient Instructions (Addendum)
Diabetes Mellitus and Nutrition, Adult When you have diabetes (diabetes mellitus), it is very important to have healthy eating habits because your blood sugar (glucose) levels are greatly affected by what you eat and drink. Eating healthy foods in the appropriate amounts, at about the same times every day, can help you:  Control your blood glucose.  Lower your risk of heart disease.  Improve your blood pressure.  Reach or maintain a healthy weight. Every person with diabetes is different, and each person has different needs for a meal plan. Your health care provider may recommend that you work with a diet and nutrition specialist (dietitian) to make a meal plan that is best for you. Your meal plan may vary depending on factors such as:  The calories you need.  The medicines you take.  Your weight.  Your blood glucose, blood pressure, and cholesterol levels.  Your activity level.  Other health conditions you have, such as heart or kidney disease. How do carbohydrates affect me? Carbohydrates, also called carbs, affect your blood glucose level more than any other type of food. Eating carbs naturally raises the amount of glucose in your blood. Carb counting is a method for keeping track of how many carbs you eat. Counting carbs is important to keep your blood glucose at a healthy level, especially if you use insulin or take certain oral diabetes medicines. It is important to know how many carbs you can safely have in each meal. This is different for every person. Your dietitian can help you calculate how many carbs you should have at each meal and for each snack. Foods that contain carbs include:  Bread, cereal, rice, pasta, and crackers.  Potatoes and corn.  Peas, beans, and lentils.  Milk and yogurt.  Fruit and juice.  Desserts, such as cakes, cookies, ice cream, and candy. How does alcohol affect me? Alcohol can cause a sudden decrease in blood glucose (hypoglycemia),  especially if you use insulin or take certain oral diabetes medicines. Hypoglycemia can be a life-threatening condition. Symptoms of hypoglycemia (sleepiness, dizziness, and confusion) are similar to symptoms of having too much alcohol. If your health care provider says that alcohol is safe for you, follow these guidelines:  Limit alcohol intake to no more than 1 drink per day for nonpregnant women and 2 drinks per day for men. One drink equals 12 oz of beer, 5 oz of wine, or 1 oz of hard liquor.  Do not drink on an empty stomach.  Keep yourself hydrated with water, diet soda, or unsweetened iced tea.  Keep in mind that regular soda, juice, and other mixers may contain a lot of sugar and must be counted as carbs. What are tips for following this plan?  Reading food labels  Start by checking the serving size on the "Nutrition Facts" label of packaged foods and drinks. The amount of calories, carbs, fats, and other nutrients listed on the label is based on one serving of the item. Many items contain more than one serving per package.  Check the total grams (g) of carbs in one serving. You can calculate the number of servings of carbs in one serving by dividing the total carbs by 15. For example, if a food has 30 g of total carbs, it would be equal to 2 servings of carbs.  Check the number of grams (g) of saturated and trans fats in one serving. Choose foods that have low or no amount of these fats.  Check the number  of milligrams (mg) of salt (sodium) in one serving. Most people should limit total sodium intake to less than 2,300 mg per day.  Always check the nutrition information of foods labeled as "low-fat" or "nonfat". These foods may be higher in added sugar or refined carbs and should be avoided.  Talk to your dietitian to identify your daily goals for nutrients listed on the label. Shopping  Avoid buying canned, premade, or processed foods. These foods tend to be high in fat, sodium,  and added sugar.  Shop around the outside edge of the grocery store. This includes fresh fruits and vegetables, bulk grains, fresh meats, and fresh dairy. Cooking  Use low-heat cooking methods, such as baking, instead of high-heat cooking methods like deep frying.  Cook using healthy oils, such as olive, canola, or sunflower oil.  Avoid cooking with butter, cream, or high-fat meats. Meal planning  Eat meals and snacks regularly, preferably at the same times every day. Avoid going long periods of time without eating.  Eat foods high in fiber, such as fresh fruits, vegetables, beans, and whole grains. Talk to your dietitian about how many servings of carbs you can eat at each meal.  Eat 4-6 ounces (oz) of lean protein each day, such as lean meat, chicken, fish, eggs, or tofu. One oz of lean protein is equal to: ? 1 oz of meat, chicken, or fish. ? 1 egg. ?  cup of tofu.  Eat some foods each day that contain healthy fats, such as avocado, nuts, seeds, and fish. Lifestyle  Check your blood glucose regularly.  Exercise regularly as told by your health care provider. This may include: ? 150 minutes of moderate-intensity or vigorous-intensity exercise each week. This could be brisk walking, biking, or water aerobics. ? Stretching and doing strength exercises, such as yoga or weightlifting, at least 2 times a week.  Take medicines as told by your health care provider.  Do not use any products that contain nicotine or tobacco, such as cigarettes and e-cigarettes. If you need help quitting, ask your health care provider.  Work with a Veterinary surgeon or diabetes educator to identify strategies to manage stress and any emotional and social challenges. Questions to ask a health care provider  Do I need to meet with a diabetes educator?  Do I need to meet with a dietitian?  What number can I call if I have questions?  When are the best times to check my blood glucose? Where to find more  information:  American Diabetes Association: diabetes.org  Academy of Nutrition and Dietetics: www.eatright.[employer] of Diabetes and Digestive and Kidney Diseases (NIH): CarFlippers.tn Summary  A healthy meal plan will help you control your blood glucose and maintain a healthy lifestyle.  Working with a diet and nutrition specialist (dietitian) can help you make a meal plan that is best for you.  Keep in mind that carbohydrates (carbs) and alcohol have immediate effects on your blood glucose levels. It is important to count carbs and to use alcohol carefully. This information is not intended to replace advice given to you by your health care provider. Make sure you discuss any questions you have with your health care provider. Document Revised: 11/09/2017 Document Reviewed: 01/01/2017 Elsevier Patient Education  2020 Elsevier Inc.  High Triglycerides Eating Plan Triglycerides are a type of fat in the blood. High levels of triglycerides can increase your risk of heart disease and stroke. If your triglyceride levels are high, choosing the right foods can  help lower your triglycerides and keep your heart healthy. Work with your health care provider or a diet and nutrition specialist (dietitian) to develop an eating plan that is right for you. What are tips for following this plan? General guidelines   Lose weight, if you are overweight. For most people, losing 5-10 lbs (2-5 kg) helps lower triglyceride levels. A weight-loss plan may include. ? 30 minutes of exercise at least 5 days a week. ? Reducing the amount of calories, sugar, and fat you eat.  Eat a wide variety of fresh fruits, vegetables, and whole grains. These foods are high in fiber.  Eat foods that contain healthy fats, such as fatty fish, nuts, seeds, and olive oil.  Avoid foods that are high in added sugar, added salt (sodium), saturated fat, and trans fat.  Avoid low-fiber, refined carbohydrates such as  white bread, crackers, noodles, and white rice.  Avoid foods with partially hydrogenated oils (trans fats), such as fried foods or stick margarine.  Limit alcohol intake to no more than 1 drink a day for nonpregnant women and 2 drinks a day for men. One drink equals 12 oz of beer, 5 oz of wine, or 1 oz of hard liquor. Your health care provider may recommend that you drink less depending on your overall health. Reading food labels  Check food labels for the amount of saturated fat. Choose foods with no or very little saturated fat.  Check food labels for the amount of trans fat. Choose foods with no trans fat.  Check food labels for the amount of cholesterol. Choose foods low in cholesterol. Ask your dietitian how much cholesterol you should have each day.  Check food labels for the amount of sodium. Choose foods with less than 140 milligrams (mg) per serving. Shopping  Buy dairy products labeled as nonfat (skim) or low-fat (1%).  Avoid buying processed or prepackaged foods. These are often high in added sugar, sodium, and fat. Cooking  Choose healthy fats when cooking, such as olive oil or canola oil.  Cook foods using lower fat methods, such as baking, broiling, boiling, or grilling.  Make your own sauces, dressings, and marinades when possible, instead of buying them. Store-bought sauces, dressings, and marinades are often high in sodium and sugar. Meal planning  Eat more home-cooked food and less restaurant, buffet, and fast food.  Eat fatty fish at least 2 times each week. Examples of fatty fish include salmon, trout, mackerel, tuna, and herring.  If you eat whole eggs, do not eat more than 3 egg yolks per week. What foods are recommended? The items listed may not be a complete list. Talk with your dietitian about what dietary choices are best for you. Grains Whole wheat or whole grain breads, crackers, cereals, and pasta. Unsweetened oatmeal. Bulgur. Barley. Quinoa. Brown  rice. Whole wheat flour tortillas. Vegetables Fresh or frozen vegetables. Low-sodium canned vegetables. Fruits All fresh, canned (in natural juice), or frozen fruits. Meats and other protein foods Skinless chicken or [place]. Ground chicken or [place]. Lean cuts of pork, trimmed of fat. Fish and seafood, especially salmon, trout, and herring. Egg whites. Dried beans, peas, or lentils. Unsalted nuts or seeds. Unsalted canned beans. Natural peanut or almond butter. Dairy Low-fat dairy products. Skim or low-fat (1%) milk. Reduced fat (2%) and low-sodium cheese. Low-fat ricotta cheese. Low-fat cottage cheese. Plain, low-fat yogurt. Fats and oils Tub margarine without trans fats. Light or reduced-fat mayonnaise. Light or reduced-fat salad dressings. Avocado. Safflower, olive, sunflower, soybean, and  canola oils. What foods are not recommended? The items listed may not be a complete list. Talk with your dietitian about what dietary choices are best for you. Grains White bread. White (regular) pasta. White rice. Cornbread. Bagels. Pastries. Crackers that contain trans fat. Vegetables Creamed or fried vegetables. Vegetables in a cheese sauce. Fruits Sweetened dried fruit. Canned fruit in syrup. Fruit juice. Meats and other protein foods Fatty cuts of meat. Ribs. Chicken wings. Berniece Salines. Sausage. Bologna. Salami. Chitterlings. Fatback. Hot dogs. Bratwurst. Packaged lunch meats. Dairy Whole or reduced-fat (2%) milk. Half-and-half. Cream cheese. Full-fat or sweetened yogurt. Full-fat cheese. Nondairy creamers. Whipped toppings. Processed cheese or cheese spreads. Cheese curds. Beverages Alcohol. Sweetened drinks, such as soda, lemonade, fruit drinks, or punches. Fats and oils Butter. Stick margarine. Lard. Shortening. Ghee. Bacon fat. Tropical oils, such as coconut, palm kernel, or palm oils. Sweets and desserts Corn syrup. Sugars. Honey. Molasses. Candy. Jam and jelly. Syrup. Sweetened cereals. Cookies.  Pies. Cakes. Donuts. Muffins. Ice cream. Condiments Store-bought sauces, dressings, and marinades that are high in sugar, such as ketchup and barbecue sauce. Summary  High levels of triglycerides can increase the risk of heart disease and stroke. Choosing the right foods can help lower your triglycerides.  Eat plenty of fresh fruits, vegetables, and whole grains. Choose low-fat dairy and lean meats. Eat fatty fish at least twice a week.  Avoid processed and prepackaged foods with added sugar, sodium, saturated fat, and trans fat.  If you need suggestions or have questions about what types of food are good for you, talk with your health care provider or a dietitian. This information is not intended to replace advice given to you by your health care provider. Make sure you discuss any questions you have with your health care provider. Document Revised: 11/09/2017 Document Reviewed: 01/30/2017 Elsevier Patient Education  [employer].   If you have lab work done today you will be contacted with your lab results within the next 2 weeks.  If you have not heard from [place] then please contact us. The fastest way to get your results is to register for My Chart.   IF you received an x-ray today, you will receive an invoice from [facility]. Please contact [facility] at [phone] with questions or concerns regarding your invoice.   IF you received labwork today, you will receive an invoice from [facility]. Please contact [facility] at [phone] with questions or concerns regarding your invoice.   Our billing staff will not be able to assist you with questions regarding bills from these companies.  You will be contacted with the lab results as soon as they are available. The fastest way to get your results is to activate your My Chart account. Instructions are located on the last page of this paperwork. If you have not heard from [place] regarding the results in 2 weeks, please  contact this office.

## 2021-01-24 ENCOUNTER — Ambulatory Visit: Payer: BC Managed Care – PPO | Admitting: Dietician

## 2021-03-09 ENCOUNTER — Ambulatory Visit: Payer: BC Managed Care – PPO | Admitting: Family Medicine

## 2021-06-01 ENCOUNTER — Other Ambulatory Visit: Payer: Self-pay | Admitting: Emergency Medicine

## 2021-06-01 DIAGNOSIS — I10 Essential (primary) hypertension: Secondary | ICD-10-CM

## 2021-09-26 ENCOUNTER — Other Ambulatory Visit: Payer: Self-pay | Admitting: Emergency Medicine

## 2021-09-26 DIAGNOSIS — I10 Essential (primary) hypertension: Secondary | ICD-10-CM

## 2022-01-09 ENCOUNTER — Other Ambulatory Visit: Payer: Self-pay | Admitting: Emergency Medicine

## 2022-01-09 DIAGNOSIS — I10 Essential (primary) hypertension: Secondary | ICD-10-CM

## 2022-06-01 NOTE — Progress Notes (Signed)
Subjective:    Janice Singleton - 56 y.o. female MRN [id number]  Date of birth: 20-Apr-1966  HPI  Janice Singleton is to establish care.   Current issues and/or concerns: - Reports has not taken hypertension medications (Amlodipine and Lisinopril) neither high cholesterol medication (Rosuvastatin) for at least 4 months due to needing refills. Previous primary care provider office closed. Reports she does eat foods with salt, no exercise outside of her normal routine, does not check blood pressures at home. Endorses she does smoke cigarettes and smoked prior to today's appointment. Consumes caffeine especially Mt. Dew. Denies red flag symptoms such as but not limited to chest pain, shortness of breath, and worst headache of life.  - Frequent urination for at least 12 months. Going to restroom at least every 20 to 25 minutes. Denies any associated urinary symptoms. - Needs vitamin D rechecked.   ROS per HPI     Health Maintenance:  Health Maintenance Due  Topic Date Due   Zoster Vaccines- Shingrix (1 of 2) Never done   COVID-19 Vaccine (3 - Pfizer series) 07/06/2020   PAP SMEAR-Modifier  03/07/2021     Past Medical History: Patient Active Problem List   Diagnosis Date Noted   Chronic idiopathic constipation 06/07/2022   Colon cancer screening 06/07/2022   Imaging of gastrointestinal tract abnormal 06/07/2022   Other hyperlipidemia 12/14/2020   Vitamin D deficiency 12/14/2020   Pre-diabetes 12/14/2020   Uterine mass 02/14/2018   Pain in pelvis 02/14/2018   Abnormal findings on diagnostic imaging of liver 02/14/2018      Social History   reports that she has been smoking cigarettes. She has been smoking an average of 2 packs per day. She has been exposed to tobacco smoke. She has never used smokeless tobacco. She reports current alcohol use of about 21.0 standard drinks of alcohol per week. She reports that she does not use drugs.   Family History  family history includes  Alcohol abuse in her mother; Healthy in her daughter; Hypertension in her brother, father, and sister.   Medications: reviewed and updated   Objective:   Physical Exam BP (!) 162/78 (BP Location: Left Arm, Patient Position: Sitting, Cuff Size: Normal)   Pulse 73   Temp 98.3 F (36.8 C)   Resp 16   Ht 5' 2.6" (1.59 m)   Wt 105 lb (47.6 kg)   LMP 01/26/2016 (Approximate)   SpO2 96%   BMI 18.84 kg/m   Physical Exam HENT:     Head: Normocephalic and atraumatic.  Eyes:     Extraocular Movements: Extraocular movements intact.     Conjunctiva/sclera: Conjunctivae normal.     Pupils: Pupils are equal, round, and reactive to light.  Cardiovascular:     Rate and Rhythm: Normal rate and regular rhythm.     Pulses: Normal pulses.     Heart sounds: Normal heart sounds.  Pulmonary:     Effort: Pulmonary effort is normal.     Breath sounds: Normal breath sounds.  Musculoskeletal:     Cervical back: Normal range of motion and neck supple.  Neurological:     General: No focal deficit present.     Mental Status: She is alert and oriented to person, place, and time.  Psychiatric:        Mood and Affect: Mood normal.        Behavior: Behavior normal.   Results for orders placed or performed in visit on 06/07/22  POCT URINALYSIS DIP (CLINITEK)  Result Value Ref Range   Color, UA yellow yellow   Clarity, UA clear clear   Glucose, UA negative negative mg/dL   Bilirubin, UA negative negative   Ketones, POC UA negative negative mg/dL   Spec Grav, UA 4.098 1.191 - 1.025   Blood, UA trace-intact (A) negative   pH, UA 5.5 5.0 - 8.0   POC PROTEIN,UA negative negative, trace   Urobilinogen, UA 0.2 0.2 or 1.0 E.U./dL   Nitrite, UA Negative Negative   Leukocytes, UA Negative Negative       Assessment & Plan:  1. Encounter to establish care - Patient presents today to establish care.  - Return for annual physical examination, labs, and health maintenance. Arrive fasting meaning having no  food for at least 8 hours prior to appointment. You may have only water or black coffee. Please take scheduled medications as normal.  2. Essential (primary) hypertension - Blood pressure not at goal during today's visit. Patient asymptomatic without chest pressure, chest pain, palpitations, shortness of breath, worst headache of life, and any additional red flag symptoms. - Resume Amlodipine and Lisinopril as prescribed.  - Counseled on blood pressure goal of less than 130/80, low-sodium, DASH diet, medication compliance, and 150 minutes of moderate intensity exercise per week as tolerated. Counseled on medication adherence and adverse effects. - BMP to evaluate kidney function and electrolyte balance. - Follow-up with primary provider in 2 weeks or sooner if needed.  - Basic Metabolic Panel - amLODipine (NORVASC) 10 MG tablet; Take 1 tablet (10 mg total) by mouth daily.  Dispense: 30 tablet; Refill: 2 - lisinopril (ZESTRIL) 30 MG tablet; Take 1 tablet (30 mg total) by mouth daily.  Dispense: 30 tablet; Refill: 2  3. Hyperlipidemia, unspecified hyperlipidemia type - Practice low-fat heart healthy diet and at least 150 minutes of moderate intensity exercise weekly as tolerated.  - Resume Rosuvastatin as prescribed.  - Follow-up with primary provider as scheduled.  - rosuvastatin (CRESTOR) 10 MG tablet; Take 1 tablet (10 mg total) by mouth daily.  Dispense: 90 tablet; Refill: 0  4. Prediabetes - Hemoglobin A1c to screen for pre-diabetes/diabetes. - Hemoglobin A1c  5. Overactive bladder 6. Frequent urination - No urinary tract infection.  - Cervicovaginal self-swab to screen for chlamydia, gonorrhea, trichomonas, bacterial vaginitis, and candida vaginitis. - Oxybutynin as prescribed. Counseled on medication adherence and adverse effects.  - Referral to Urogynecology for further evaluation and management. - POCT URINALYSIS DIP (CLINITEK) - Cervicovaginal ancillary only - oxybutynin  (DITROPAN XL) 5 MG 24 hr tablet; Take 1 tablet (5 mg total) by mouth at bedtime.  Dispense: 30 tablet; Refill: 0 - Ambulatory referral to Urogynecology  7. Vitamin D deficiency - Screening for deficiency.  - Vitamin D, 25-hydroxy     Patient was given clear instructions to go to Emergency Department or return to medical center if symptoms don't improve, worsen, or new problems develop.The patient verbalized understanding.  I discussed the assessment and treatment plan with the patient. The patient was provided an opportunity to ask questions and all were answered. The patient agreed with the plan and demonstrated an understanding of the instructions.   The patient was advised to call back or seek an in-person evaluation if the symptoms worsen or if the condition fails to improve as anticipated.    Ricky Stabs, NP 06/07/2022, 2:38 PM Primary [facility] at [facility]

## 2022-06-07 ENCOUNTER — Other Ambulatory Visit (HOSPITAL_COMMUNITY)
Admission: RE | Admit: 2022-06-07 | Discharge: 2022-06-07 | Disposition: A | Discharge status: Home / Self Care | Payer: BC Managed Care – PPO | Source: Ambulatory Visit | Attending: Family | Admitting: Family

## 2022-06-07 ENCOUNTER — Encounter: Payer: Self-pay | Admitting: Family

## 2022-06-07 ENCOUNTER — Ambulatory Visit (INDEPENDENT_AMBULATORY_CARE_PROVIDER_SITE_OTHER): Payer: BC Managed Care – PPO | Admitting: Family

## 2022-06-07 VITALS — BP 162/78 | HR 73 | Temp 98.3°F | Resp 16 | Ht 62.6 in | Wt 105.0 lb

## 2022-06-07 DIAGNOSIS — R7303 Prediabetes: Secondary | ICD-10-CM | POA: Diagnosis not present

## 2022-06-07 DIAGNOSIS — E785 Hyperlipidemia, unspecified: Secondary | ICD-10-CM | POA: Diagnosis not present

## 2022-06-07 DIAGNOSIS — R350 Frequency of micturition: Secondary | ICD-10-CM | POA: Insufficient documentation

## 2022-06-07 DIAGNOSIS — Z7689 Persons encountering health services in other specified circumstances: Secondary | ICD-10-CM

## 2022-06-07 DIAGNOSIS — Z1211 Encounter for screening for malignant neoplasm of colon: Secondary | ICD-10-CM | POA: Insufficient documentation

## 2022-06-07 DIAGNOSIS — I10 Essential (primary) hypertension: Secondary | ICD-10-CM | POA: Diagnosis not present

## 2022-06-07 DIAGNOSIS — N760 Acute vaginitis: Secondary | ICD-10-CM | POA: Diagnosis not present

## 2022-06-07 DIAGNOSIS — R933 Abnormal findings on diagnostic imaging of other parts of digestive tract: Secondary | ICD-10-CM | POA: Insufficient documentation

## 2022-06-07 DIAGNOSIS — E559 Vitamin D deficiency, unspecified: Secondary | ICD-10-CM

## 2022-06-07 DIAGNOSIS — N3281 Overactive bladder: Secondary | ICD-10-CM

## 2022-06-07 DIAGNOSIS — K5904 Chronic idiopathic constipation: Secondary | ICD-10-CM | POA: Insufficient documentation

## 2022-06-07 LAB — POCT URINALYSIS DIP (CLINITEK)
Bilirubin, UA: NEGATIVE
Glucose, UA: NEGATIVE mg/dL
Ketones, POC UA: NEGATIVE mg/dL
Leukocytes, UA: NEGATIVE
Nitrite, UA: NEGATIVE
POC PROTEIN,UA: NEGATIVE
Spec Grav, UA: 1.025 (ref 1.010–1.025)
Urobilinogen, UA: 0.2 E.U./dL
pH, UA: 5.5 (ref 5.0–8.0)

## 2022-06-07 MED ORDER — LISINOPRIL 30 MG PO TABS: 30.0000 mg | ORAL_TABLET | Freq: Every day | ORAL | 2 refills | Status: AC

## 2022-06-07 MED ORDER — OXYBUTYNIN CHLORIDE ER 5 MG PO TB24: 5.0000 mg | ORAL_TABLET | Freq: Every day | ORAL | 0 refills | Status: DC

## 2022-06-07 MED ORDER — AMLODIPINE BESYLATE 10 MG PO TABS: 10.0000 mg | ORAL_TABLET | Freq: Every day | ORAL | 2 refills | Status: DC

## 2022-06-07 MED ORDER — ROSUVASTATIN CALCIUM 10 MG PO TABS: 10.0000 mg | ORAL_TABLET | Freq: Every day | ORAL | 0 refills | Status: DC

## 2022-06-07 NOTE — Patient Instructions (Signed)
Thank you for choosing Primary Care at [facility] for your medical home!    Janice Singleton was seen by Rema Fendt, NP today.   Ferol Luz primary care provider is Ricky Stabs, NP.   For the best care possible,  you should try to see Ricky Stabs, NP whenever you come to office.   We look forward to seeing you again soon!  If you have any questions about your visit today,  please call us at [phone]  Or feel free to reach your provider via MyChart.    Keeping you healthy   Get these tests Blood pressure- Have your blood pressure checked once a year by your healthcare provider.  Normal blood pressure is 120/80. Weight- Have your body mass index (BMI) calculated to screen for obesity.  BMI is a measure of body fat based on height and weight. You can also calculate your own BMI at https://www.west-esparza.com/. Cholesterol- Have your cholesterol checked regularly starting at age 64, sooner may be necessary if you have diabetes, high blood pressure, if a family member developed heart diseases at an early age or if you smoke.  Chlamydia, HIV, and other sexual transmitted disease- Get screened each year until the age of 37 then within three months of each new sexual partner. Diabetes- Have your blood sugar checked regularly if you have high blood pressure, high cholesterol, a family history of diabetes or if you are overweight.   Get these vaccines Flu shot- Every fall. Tetanus shot- Every 10 years. Menactra- Single dose; prevents meningitis.   Take these steps Don't smoke- If you do smoke, ask your healthcare provider about quitting. For tips on how to quit, go to www.smokefree.gov or call 1-800-QUIT-NOW. Be physically active- Exercise 5 days a week for at least 30 minutes.  If you are not already physically active start slow and gradually work up to 30 minutes of moderate physical activity.  Examples of moderate activity include walking briskly, mowing the yard, dancing,  swimming bicycling, etc. Eat a healthy diet- Eat a variety of healthy foods such as fruits, vegetables, low fat milk, low fat cheese, yogurt, lean meats, poultry, fish, beans, tofu, etc.  For more information on healthy eating, go to www.thenutritionsource.org Drink alcohol in moderation- Limit alcohol intake two drinks or less a day.  Never drink and drive. Dentist- Brush and floss teeth twice daily; visit your dentis twice a year. Depression-Your emotional health is as important as your physical health.  If you're feeling down, losing interest in things you normally enjoy please talk with your healthcare provider. Gun Safety- If you keep a gun in your home, keep it unloaded and with the safety lock on.  Bullets should be stored separately. Helmet use- Always wear a helmet when riding a motorcycle, bicycle, rollerblading or skateboarding. Safe sex- If you may be exposed to a sexually transmitted infection, use a condom Seat belts- Seat bels can save your life; always wear one. Smoke/Carbon Monoxide detectors- These detectors need to be installed on the appropriate level of your home.  Replace batteries at least once a year. Skin Cancer- When out in the sun, cover up and use sunscreen SPF 15 or higher. Violence- If anyone is threatening or hurting you, please tell your healthcare provider.

## 2022-06-07 NOTE — Progress Notes (Signed)
.  Pt presents to establish care, been experiencing frequent urination that has been going on about year. Pt has been keeping track of bathroom usage and shows approx every 10-20 minutes urinating   Pt been out of meds for approx 3-4 months

## 2022-06-08 ENCOUNTER — Other Ambulatory Visit: Payer: Self-pay

## 2022-06-08 ENCOUNTER — Other Ambulatory Visit: Payer: Self-pay | Admitting: Family

## 2022-06-08 DIAGNOSIS — L299 Pruritus, unspecified: Secondary | ICD-10-CM

## 2022-06-08 DIAGNOSIS — B9689 Other specified bacterial agents as the cause of diseases classified elsewhere: Secondary | ICD-10-CM

## 2022-06-08 DIAGNOSIS — M79604 Pain in right leg: Secondary | ICD-10-CM

## 2022-06-08 LAB — CERVICOVAGINAL ANCILLARY ONLY
Bacterial Vaginitis (gardnerella): POSITIVE — AB
Candida Glabrata: NEGATIVE
Candida Vaginitis: NEGATIVE
Chlamydia: NEGATIVE
Comment: NEGATIVE
Comment: NEGATIVE
Comment: NEGATIVE
Comment: NEGATIVE
Comment: NEGATIVE
Comment: NORMAL
Neisseria Gonorrhea: NEGATIVE
Trichomonas: NEGATIVE

## 2022-06-08 LAB — BASIC METABOLIC PANEL
BUN/Creatinine Ratio: 19 (ref 9–23)
BUN: 13 mg/dL (ref 6–24)
CO2: 20 mmol/L (ref 20–29)
Calcium: 9.3 mg/dL (ref 8.7–10.2)
Chloride: 109 mmol/L — ABNORMAL HIGH (ref 96–106)
Creatinine, Ser: 0.67 mg/dL (ref 0.57–1.00)
Glucose: 122 mg/dL — ABNORMAL HIGH (ref 70–99)
Potassium: 3.8 mmol/L (ref 3.5–5.2)
Sodium: 143 mmol/L (ref 134–144)
eGFR: 101 mL/min/{1.73_m2} (ref >59)

## 2022-06-08 LAB — HEMOGLOBIN A1C
Est. average glucose Bld gHb Est-mCnc: 111 mg/dL
Hgb A1c MFr Bld: 5.5 % (ref 4.8–5.6)

## 2022-06-08 MED ORDER — IBUPROFEN 800 MG PO TABS: 800.0000 mg | ORAL_TABLET | Freq: Three times a day (TID) | ORAL | 1 refills | Status: DC | PRN

## 2022-06-08 MED ORDER — METRONIDAZOLE 500 MG PO TABS: 500.0000 mg | ORAL_TABLET | Freq: Two times a day (BID) | ORAL | 0 refills | Status: AC

## 2022-06-08 MED ORDER — GABAPENTIN 300 MG PO CAPS: 300.0000 mg | ORAL_CAPSULE | Freq: Three times a day (TID) | ORAL | 0 refills | Status: DC

## 2022-06-08 MED ORDER — TRIAMCINOLONE ACETONIDE 0.5 % EX OINT: 1.0000 | TOPICAL_OINTMENT | Freq: Two times a day (BID) | CUTANEOUS | 3 refills | Status: AC

## 2022-06-19 NOTE — Progress Notes (Signed)
Patient ID: Janice Singleton, female    DOB: 01-May-1966  MRN: [id number]  CC: Annual Physical Exam   Subjective: Janice Singleton is a 56 y.o. female who presents for annual physical exam.   Her concerns today include:  - Doing well on blood pressure medications. No issues/concerns.  - Reports chronic mons pubis itching. Taking medication in the past to help. Requesting medication to help.  - Requests yeast infection medication. Usually has yeast infection after finishing antibiotics. Recently completed Metronidazole for BV.    Patient Active Problem List   Diagnosis Date Noted   Bacterial vaginitis 06/08/2022   Chronic idiopathic constipation 06/07/2022   Colon cancer screening 06/07/2022   Imaging of gastrointestinal tract abnormal 06/07/2022   Other hyperlipidemia 12/14/2020   Vitamin D deficiency 12/14/2020   Pre-diabetes 12/14/2020   Uterine mass 02/14/2018   Pain in pelvis 02/14/2018   Abnormal findings on diagnostic imaging of liver 02/14/2018     Current Outpatient Medications on File Prior to Visit  Medication Sig Dispense Refill   metroNIDAZOLE (FLAGYL) 250 MG tablet Take 250 mg by mouth 2 (two) times daily.     amLODipine (NORVASC) 10 MG tablet Take 1 tablet (10 mg total) by mouth daily. 30 tablet 2   gabapentin (NEURONTIN) 300 MG capsule Take 1 capsule (300 mg total) by mouth 3 (three) times daily. 270 capsule 0   ibuprofen (ADVIL) 800 MG tablet Take 1 tablet (800 mg total) by mouth every 8 (eight) hours as needed for moderate pain. 30 tablet 1   lisinopril (ZESTRIL) 30 MG tablet Take 1 tablet (30 mg total) by mouth daily. 30 tablet 2   loratadine (CLARITIN) 10 MG tablet Take 1 tablet (10 mg total) by mouth daily. 30 tablet 11   oxybutynin (DITROPAN XL) 5 MG 24 hr tablet Take 1 tablet (5 mg total) by mouth at bedtime. 30 tablet 0   rosuvastatin (CRESTOR) 10 MG tablet Take 1 tablet (10 mg total) by mouth daily. 90 tablet 0   triamcinolone ointment (KENALOG) 0.5 % Apply 1  Application topically 2 (two) times daily. 30 g 3   Vitamin D, Ergocalciferol, (DRISDOL) 1.25 MG (50000 UNIT) CAPS capsule Take 1 capsule (50,000 Units total) by mouth every 7 (seven) days. 5 capsule 6   No current facility-administered medications on file prior to visit.    Allergies  Allergen Reactions   Other Other (See Comments)    ALL antibiotics cause yeast infections    Social History   Socioeconomic History   Marital status: Single    Spouse name: Not on file   Number of children: Not on file   Years of education: Not on file   Highest education level: Not on file  Occupational History   Occupation: Social research officer, government  Tobacco Use   Smoking status: Every Day    Packs/day: 2.00    Types: Cigarettes    Passive exposure: Current   Smokeless tobacco: Never  Vaping Use   Vaping Use: Never used  Substance and Sexual Activity   Alcohol use: Yes    Alcohol/week: 21.0 standard drinks of alcohol    Types: 21 Cans of beer per week    Comment: 3 beers a day   Drug use: No   Sexual activity: Yes    Birth control/protection: None  Other Topics Concern   Not on file  Social History Narrative   Patient alone lives in [place].   Social Determinants of Health   Financial Resource Strain:  Not on file  Food Insecurity: Not on file  Transportation Needs: Not on file  Physical Activity: Not on file  Stress: Not on file  Social Connections: Not on file  Intimate Partner Violence: Not on file    Family History  Problem Relation Age of Onset   Alcohol abuse Mother    Hypertension Father    Hypertension Sister    Hypertension Brother    Healthy Daughter    Breast cancer Neg Hx     Past Surgical History:  Procedure Laterality Date   CESAREAN SECTION      ROS: Review of Systems Negative except as stated above  PHYSICAL EXAM: BP 137/87 (BP Location: Left Arm, Patient Position: Sitting, Cuff Size: Normal)   Pulse 83   Temp 98.3 F (36.8 C)   Resp 16   Ht 5'  2.6" (1.59 m)   Wt 104 lb (47.2 kg)   LMP 01/26/2016 (Approximate)   SpO2 95%   BMI 18.66 kg/m   Physical Exam HENT:     Head: Normocephalic and atraumatic.     Right Ear: Tympanic membrane, ear canal and external ear normal.     Left Ear: Tympanic membrane, ear canal and external ear normal.     Nose: Nose normal.     Mouth/Throat:     Mouth: Mucous membranes are moist.     Pharynx: Oropharynx is clear.  Eyes:     Extraocular Movements: Extraocular movements intact.     Conjunctiva/sclera: Conjunctivae normal.     Pupils: Pupils are equal, round, and reactive to light.  Cardiovascular:     Rate and Rhythm: Normal rate and regular rhythm.     Pulses: Normal pulses.     Heart sounds: Normal heart sounds.  Pulmonary:     Effort: Pulmonary effort is normal.     Breath sounds: Normal breath sounds.  Chest:     Comments: Patient declined.  Abdominal:     General: Bowel sounds are normal.     Palpations: Abdomen is soft.  Genitourinary:    General: Normal vulva.     Vagina: Normal.     Cervix: Normal.     Uterus: Normal.      Adnexa: Right adnexa normal and left adnexa normal.     Comments: Elmon Else, CMA present. Musculoskeletal:        General: Normal range of motion.     Right shoulder: Normal.     Left shoulder: Normal.     Right upper arm: Normal.     Left upper arm: Normal.     Right elbow: Normal.     Left elbow: Normal.     Right forearm: Normal.     Left forearm: Normal.     Right wrist: Normal.     Left wrist: Normal.     Right hand: Normal.     Left hand: Normal.     Cervical back: Normal, normal range of motion and neck supple.     Thoracic back: Normal.     Lumbar back: Normal.     Right hip: Normal.     Left hip: Normal.     Right upper leg: Normal.     Left upper leg: Normal.     Right knee: Normal.     Left knee: Normal.     Right lower leg: Normal.     Left lower leg: Normal.     Right ankle: Normal.     Left ankle: Normal.  Right  foot: Normal.     Left foot: Normal.  Skin:    General: Skin is warm and dry.     Capillary Refill: Capillary refill takes less than 2 seconds.  Neurological:     General: No focal deficit present.     Mental Status: She is alert and oriented to person, place, and time.  Psychiatric:        Mood and Affect: Mood normal.        Behavior: Behavior normal.     ASSESSMENT AND PLAN: 1. Annual physical exam - Counseled on 150 minutes of exercise per week as tolerated, healthy eating (including decreased daily intake of saturated fats, cholesterol, added sugars, sodium), STI prevention, and routine healthcare maintenance.  2. Screening for metabolic disorder - Screening liver function.  - Hepatic Function Panel  3. Screening for deficiency anemia - CBC to screen for anemia. - CBC  4. Thyroid disorder screen - TSH to check thyroid function.  - TSH  5. Encounter for screening mammogram for malignant neoplasm of breast - Referral for breast cancer screening by mammogram.  - MM Digital Screening; Future  6. Pap smear for cervical cancer screening - Cytology - PAP for cervical cancer screening.  - Cytology - PAP([place])  7. Routine screening for STI (sexually transmitted infection) - Cervicovaginal self-swab to screen for chlamydia, gonorrhea, trichomonas, bacterial vaginitis, and candida vaginitis. - Cervicovaginal ancillary only  8. Essential (primary) hypertension - Continue Amlodipine and Lisinopril as prescribed. No refills needed as of present.  - Counseled on blood pressure goal of less than 130/80, low-sodium, DASH diet, medication compliance, and 150 minutes of moderate intensity exercise per week as tolerated. Counseled on medication adherence and adverse effects. - CMP14+EGFR to check kidney function, liver function, and electrolyte balance.  - Follow-up with primary provider in 4 months or sooner if needed.  - CMP14+EGFR  9. Hyperlipidemia, unspecified  hyperlipidemia type - Update lipid panel. - Lipid panel  10. Vitamin D deficiency - Screening for deficiency.  - Vitamin D, 25-hydroxy  11. Vaginal yeast infection - Fluconazole as prescribed.  - Follow-up as needed.  - fluconazole (DIFLUCAN) 150 MG tablet; Take 1 tablet (150 mg total) by mouth once for 1 dose.  Dispense: 1 tablet; Refill: 0  12. Pruritus vulvae - Clobetasol ointment as prescribed. Counseled on medication adherence and adverse effects.  - Follow-up with primary provider as scheduled.  - clobetasol ointment (TEMOVATE) 0.05 %; Apply 1 Application topically 2 (two) times daily.  Dispense: 60 g; Refill: 1    Patient was given the opportunity to ask questions.  Patient verbalized understanding of the plan and was able to repeat key elements of the plan. Patient was given clear instructions to go to Emergency Department or return to medical center if symptoms don't improve, worsen, or new problems develop.The patient verbalized understanding.   Orders Placed This Encounter  Procedures   MM Digital Screening   Hepatic Function Panel   CBC   Lipid panel   TSH   CMP14+EGFR   Vitamin D, 25-hydroxy     Requested Prescriptions   Signed Prescriptions Disp Refills   fluconazole (DIFLUCAN) 150 MG tablet 1 tablet 0    Sig: Take 1 tablet (150 mg total) by mouth once for 1 dose.   clobetasol ointment (TEMOVATE) 0.05 % 60 g 1    Sig: Apply 1 Application topically 2 (two) times daily.    Return in about 1 year (around 06/28/2023) for Physical per patient  preference, Follow-Up or next available 4 months chronic care mgmt .  Camillia Herter, NP

## 2022-06-27 ENCOUNTER — Encounter: Payer: Self-pay | Admitting: Family

## 2022-06-27 ENCOUNTER — Telehealth: Payer: Self-pay | Admitting: Family

## 2022-06-27 ENCOUNTER — Ambulatory Visit (INDEPENDENT_AMBULATORY_CARE_PROVIDER_SITE_OTHER): Payer: BC Managed Care – PPO | Admitting: Family

## 2022-06-27 ENCOUNTER — Other Ambulatory Visit (HOSPITAL_COMMUNITY)
Admission: RE | Admit: 2022-06-27 | Discharge: 2022-06-27 | Disposition: A | Discharge status: Home / Self Care | Payer: BC Managed Care – PPO | Source: Ambulatory Visit | Attending: Family | Admitting: Family

## 2022-06-27 VITALS — BP 137/87 | HR 83 | Temp 98.3°F | Resp 16 | Ht 62.6 in | Wt 104.0 lb

## 2022-06-27 DIAGNOSIS — I10 Essential (primary) hypertension: Secondary | ICD-10-CM

## 2022-06-27 DIAGNOSIS — E559 Vitamin D deficiency, unspecified: Secondary | ICD-10-CM

## 2022-06-27 DIAGNOSIS — Z0000 Encounter for general adult medical examination without abnormal findings: Secondary | ICD-10-CM

## 2022-06-27 DIAGNOSIS — E785 Hyperlipidemia, unspecified: Secondary | ICD-10-CM | POA: Diagnosis not present

## 2022-06-27 DIAGNOSIS — Z0001 Encounter for general adult medical examination with abnormal findings: Secondary | ICD-10-CM | POA: Diagnosis not present

## 2022-06-27 DIAGNOSIS — Z13228 Encounter for screening for other metabolic disorders: Secondary | ICD-10-CM | POA: Diagnosis not present

## 2022-06-27 DIAGNOSIS — Z124 Encounter for screening for malignant neoplasm of cervix: Secondary | ICD-10-CM | POA: Insufficient documentation

## 2022-06-27 DIAGNOSIS — Z1231 Encounter for screening mammogram for malignant neoplasm of breast: Secondary | ICD-10-CM

## 2022-06-27 DIAGNOSIS — B3731 Acute candidiasis of vulva and vagina: Secondary | ICD-10-CM | POA: Diagnosis not present

## 2022-06-27 DIAGNOSIS — Z1329 Encounter for screening for other suspected endocrine disorder: Secondary | ICD-10-CM

## 2022-06-27 DIAGNOSIS — L292 Pruritus vulvae: Secondary | ICD-10-CM

## 2022-06-27 DIAGNOSIS — Z113 Encounter for screening for infections with a predominantly sexual mode of transmission: Secondary | ICD-10-CM

## 2022-06-27 DIAGNOSIS — Z130 Encounter for screening for diseases of the blood and blood-forming organs and certain disorders involving the immune mechanism: Secondary | ICD-10-CM

## 2022-06-27 MED ORDER — CLOBETASOL PROPIONATE 0.05 % EX OINT: 1.0000 | TOPICAL_OINTMENT | Freq: Two times a day (BID) | CUTANEOUS | 1 refills | Status: AC

## 2022-06-27 MED ORDER — FLUCONAZOLE 150 MG PO TABS: 150.0000 mg | ORAL_TABLET | Freq: Once | ORAL | 0 refills | Status: AC

## 2022-06-27 NOTE — Telephone Encounter (Signed)
Pt returned after today's appt stating she forgot to ask during her appt for patches for pain for both her legs that she's been having. Pt asked for a phone call if nurse needed to speak w/ her at the [phone] number. Thank you.

## 2022-06-27 NOTE — Patient Instructions (Signed)
Preventive Care 40-58 Years Old, Female Preventive care refers to lifestyle choices and visits with your health care provider that can promote health and wellness. Preventive care visits are also called wellness exams. What can I expect for my preventive care visit? Counseling Your health care provider may ask you questions about your: Medical history, including: Past medical problems. Family medical history. Pregnancy history. Current health, including: Menstrual cycle. Method of birth control. Emotional well-being. Home life and relationship well-being. Sexual activity and sexual health. Lifestyle, including: Alcohol, nicotine or tobacco, and drug use. Access to firearms. Diet, exercise, and sleep habits. Work and work environment. Sunscreen use. Safety issues such as seatbelt and bike helmet use. Physical exam Your health care provider will check your: Height and weight. These may be used to calculate your BMI (body mass index). BMI is a measurement that tells if you are at a healthy weight. Waist circumference. This measures the distance around your waistline. This measurement also tells if you are at a healthy weight and may help predict your risk of certain diseases, such as type 2 diabetes and high blood pressure. Heart rate and blood pressure. Body temperature. Skin for abnormal spots. What immunizations do I need?  Vaccines are usually given at various ages, according to a schedule. Your health care provider will recommend vaccines for you based on your age, medical history, and lifestyle or other factors, such as travel or where you work. What tests do I need? Screening Your health care provider may recommend screening tests for certain conditions. This may include: Lipid and cholesterol levels. Diabetes screening. This is done by checking your blood sugar (glucose) after you have not eaten for a while (fasting). Pelvic exam and Pap test. Hepatitis B test. Hepatitis C  test. HIV (human immunodeficiency virus) test. STI (sexually transmitted infection) testing, if you are at risk. Lung cancer screening. Colorectal cancer screening. Mammogram. Talk with your health care provider about when you should start having regular mammograms. This may depend on whether you have a family history of breast cancer. BRCA-related cancer screening. This may be done if you have a family history of breast, ovarian, tubal, or peritoneal cancers. Bone density scan. This is done to screen for osteoporosis. Talk with your health care provider about your test results, treatment options, and if necessary, the need for more tests. Follow these instructions at home: Eating and drinking  Eat a diet that includes fresh fruits and vegetables, whole grains, lean protein, and low-fat dairy products. Take vitamin and mineral supplements as recommended by your health care provider. Do not drink alcohol if: Your health care provider tells you not to drink. You are pregnant, may be pregnant, or are planning to become pregnant. If you drink alcohol: Limit how much you have to 0-1 drink a day. Know how much alcohol is in your drink. In the [place] one drink equals one 12 oz bottle of beer (355 mL), one 5 oz glass of wine (148 mL), or one 1 oz glass of hard liquor (44 mL). Lifestyle Brush your teeth every morning and night with fluoride toothpaste. Floss one time each day. Exercise for at least 30 minutes 5 or more days each week. Do not use any products that contain nicotine or tobacco. These products include cigarettes, chewing tobacco, and vaping devices, such as e-cigarettes. If you need help quitting, ask your health care provider. Do not use drugs. If you are sexually active, practice safe sex. Use a condom or other form of protection to   prevent STIs. If you do not wish to become pregnant, use a form of birth control. If you plan to become pregnant, see your health care provider for a  prepregnancy visit. Take aspirin only as told by your health care provider. Make sure that you understand how much to take and what form to take. Work with your health care provider to find out whether it is safe and beneficial for you to take aspirin daily. Find healthy ways to manage stress, such as: Meditation, yoga, or listening to music. Journaling. Talking to a trusted person. Spending time with friends and family. Minimize exposure to UV radiation to reduce your risk of skin cancer. Safety Always wear your seat belt while driving or riding in a vehicle. Do not drive: If you have been drinking alcohol. Do not ride with someone who has been drinking. When you are tired or distracted. While texting. If you have been using any mind-altering substances or drugs. Wear a helmet and other protective equipment during sports activities. If you have firearms in your house, make sure you follow all gun safety procedures. Seek help if you have been physically or sexually abused. What's next? Visit your health care provider once a year for an annual wellness visit. Ask your health care provider how often you should have your eyes and teeth checked. Stay up to date on all vaccines. This information is not intended to replace advice given to you by your health care provider. Make sure you discuss any questions you have with your health care provider. Document Revised: 05/25/2021 Document Reviewed: 05/25/2021 Elsevier Patient Education  [place].

## 2022-06-27 NOTE — Progress Notes (Signed)
Pt presents

## 2022-06-28 ENCOUNTER — Other Ambulatory Visit: Payer: Self-pay | Admitting: Family

## 2022-06-28 DIAGNOSIS — B9689 Other specified bacterial agents as the cause of diseases classified elsewhere: Secondary | ICD-10-CM

## 2022-06-28 DIAGNOSIS — E559 Vitamin D deficiency, unspecified: Secondary | ICD-10-CM

## 2022-06-28 LAB — LIPID PANEL
Chol/HDL Ratio: 2.7 ratio (ref 0.0–4.4)
Cholesterol, Total: 181 mg/dL (ref 100–199)
HDL: 67 mg/dL (ref >39)
LDL Chol Calc (NIH): 87 mg/dL (ref 0–99)
Triglycerides: 157 mg/dL — ABNORMAL HIGH (ref 0–149)
VLDL Cholesterol Cal: 27 mg/dL (ref 5–40)

## 2022-06-28 LAB — CERVICOVAGINAL ANCILLARY ONLY
Bacterial Vaginitis (gardnerella): POSITIVE — AB
Candida Glabrata: NEGATIVE
Candida Vaginitis: NEGATIVE
Chlamydia: NEGATIVE
Comment: NEGATIVE
Comment: NEGATIVE
Comment: NEGATIVE
Comment: NEGATIVE
Comment: NEGATIVE
Comment: NORMAL
Neisseria Gonorrhea: NEGATIVE
Trichomonas: NEGATIVE

## 2022-06-28 LAB — CMP14+EGFR
ALT: 14 IU/L (ref 0–32)
AST: 18 IU/L (ref 0–40)
Albumin/Globulin Ratio: 1.5 (ref 1.2–2.2)
Albumin: 4.1 g/dL (ref 3.8–4.9)
Alkaline Phosphatase: 100 IU/L (ref 44–121)
BUN/Creatinine Ratio: 14 (ref 9–23)
BUN: 10 mg/dL (ref 6–24)
Bilirubin Total: 0.2 mg/dL (ref 0.0–1.2)
CO2: 20 mmol/L (ref 20–29)
Calcium: 9.2 mg/dL (ref 8.7–10.2)
Chloride: 108 mmol/L — ABNORMAL HIGH (ref 96–106)
Creatinine, Ser: 0.69 mg/dL (ref 0.57–1.00)
Globulin, Total: 2.8 g/dL (ref 1.5–4.5)
Glucose: 101 mg/dL — ABNORMAL HIGH (ref 70–99)
Potassium: 4.3 mmol/L (ref 3.5–5.2)
Sodium: 143 mmol/L (ref 134–144)
Total Protein: 6.9 g/dL (ref 6.0–8.5)
eGFR: 101 mL/min/{1.73_m2} (ref >59)

## 2022-06-28 LAB — TSH: TSH: 1.15 u[IU]/mL (ref 0.450–4.500)

## 2022-06-28 LAB — HEPATIC FUNCTION PANEL: Bilirubin, Direct: 0.11 mg/dL (ref 0.00–0.40)

## 2022-06-28 LAB — CBC
Hematocrit: 46.9 % — ABNORMAL HIGH (ref 34.0–46.6)
Hemoglobin: 15.8 g/dL (ref 11.1–15.9)
MCH: 32.2 pg (ref 26.6–33.0)
MCHC: 33.7 g/dL (ref 31.5–35.7)
MCV: 96 fL (ref 79–97)
Platelets: 252 10*3/uL (ref 150–450)
RBC: 4.9 x10E6/uL (ref 3.77–5.28)
RDW: 12.9 % (ref 11.7–15.4)
WBC: 7.4 10*3/uL (ref 3.4–10.8)

## 2022-06-28 LAB — VITAMIN D 25 HYDROXY (VIT D DEFICIENCY, FRACTURES): Vit D, 25-Hydroxy: 27.9 ng/mL — ABNORMAL LOW (ref 30.0–100.0)

## 2022-06-28 MED ORDER — VITAMIN D (ERGOCALCIFEROL) 1.25 MG (50000 UNIT) PO CAPS: 50000.0000 [IU] | ORAL_CAPSULE | ORAL | 2 refills | Status: DC

## 2022-06-28 MED ORDER — METRONIDAZOLE 500 MG PO TABS: 500.0000 mg | ORAL_TABLET | Freq: Two times a day (BID) | ORAL | 0 refills | Status: AC

## 2022-06-28 NOTE — Telephone Encounter (Signed)
No history of prescription for Lidocaine patches. Try over-the-counter equivalent. Follow-up as needed.

## 2022-06-29 LAB — CYTOLOGY - PAP
Comment: NEGATIVE
Diagnosis: NEGATIVE
High risk HPV: NEGATIVE

## 2022-06-29 NOTE — Telephone Encounter (Signed)
Spoke to pt advised that she would need to schedule appt to discuss with provider if want to start new medication, pt stated she would wait and continue use Ibuprofen 800mg   for now

## 2022-07-11 ENCOUNTER — Ambulatory Visit: Payer: BC Managed Care – PPO

## 2022-07-11 ENCOUNTER — Other Ambulatory Visit: Payer: Self-pay | Admitting: Family

## 2022-07-11 DIAGNOSIS — N3281 Overactive bladder: Secondary | ICD-10-CM

## 2022-07-11 DIAGNOSIS — R350 Frequency of micturition: Secondary | ICD-10-CM

## 2022-07-11 NOTE — Telephone Encounter (Signed)
Order complete. 

## 2022-07-19 ENCOUNTER — Ambulatory Visit
Admission: RE | Admit: 2022-07-19 | Discharge: 2022-07-19 | Disposition: A | Discharge status: Home / Self Care | Payer: BC Managed Care – PPO | Source: Ambulatory Visit | Attending: Family | Admitting: Family

## 2022-07-19 DIAGNOSIS — Z1231 Encounter for screening mammogram for malignant neoplasm of breast: Secondary | ICD-10-CM | POA: Diagnosis not present

## 2022-08-15 ENCOUNTER — Other Ambulatory Visit: Payer: Self-pay | Admitting: Family

## 2022-08-15 DIAGNOSIS — I10 Essential (primary) hypertension: Secondary | ICD-10-CM

## 2022-08-16 NOTE — Telephone Encounter (Signed)
Requested Prescriptions  Pending Prescriptions Disp Refills  . amLODipine (NORVASC) 10 MG tablet [Pharmacy Med Name: amlodipine 10 mg tablet] 30 tablet 2    Sig: TAKE 1 TABLET BY MOUTH EVERY DAY     Cardiovascular: Calcium Channel Blockers 2 Passed - 08/15/2022  1:16 PM      Passed - Last BP in normal range    BP Readings from Last 1 Encounters:  06/27/22 137/87         Passed - Last Heart Rate in normal range    Pulse Readings from Last 1 Encounters:  06/27/22 83         Passed - Valid encounter within last 6 months    Recent Outpatient Visits          1 month ago Annual physical exam   Primary Care at [facility], Amy J, NP   2 months ago Encounter to establish care   Primary Care at [facility], Amy J, NP   1 year ago Other hyperlipidemia   Primary Care at [place] [facility], Azalee Course, FNP   1 year ago Burning with urination   Primary Care at [facility], Azalee Course, FNP   2 years ago Left thigh pain   Primary Care at [facility], Meda Coffee, MD      Future Appointments            In 2 months Rema Fendt, NP Primary Care at [facility]   In 3 months Florian Buff, Rochel Brome, MD Urogynecology at [facility], [facility]

## 2022-10-09 ENCOUNTER — Other Ambulatory Visit: Payer: Self-pay | Admitting: Family

## 2022-10-09 DIAGNOSIS — E559 Vitamin D deficiency, unspecified: Secondary | ICD-10-CM

## 2022-10-10 NOTE — Telephone Encounter (Signed)
Requested medication (s) are due for refill today: yes  Requested medication (s) are on the active medication list: expired 09/14/22  Last refill:  06/28/22 #4 2 RF  Future visit scheduled: yes  Notes to clinic:  med not delegated to NT to RF- med has expired   Requested Prescriptions  Pending Prescriptions Disp Refills   Vitamin D, Ergocalciferol, (DRISDOL) 1.25 MG (50000 UNIT) CAPS capsule [Pharmacy Med Name: ergocalciferol (vitamin D2) 1,250 mcg (50,000 unit) capsule] 4 capsule 2    Sig: Take 1 capsule by mouth every 7 days for 12 doses.     Endocrinology:  Vitamins - Vitamin D Supplementation 2 Failed - 10/09/2022 10:06 AM      Failed - Manual Review: Route requests for 50,000 IU strength to the provider      Failed - Vitamin D in normal range and within 360 days    Vit D, 25-Hydroxy  Date Value Ref Range Status  06/27/2022 27.9 (L) 30.0 - 100.0 ng/mL Final    Comment:    Vitamin D deficiency has been defined by the [place] practice guideline as a level of serum 25-OH vitamin D less than 20 ng/mL (1,2). The Endocrine Society went on to further define vitamin D insufficiency as a level between 21 and 29 ng/mL (2). 1. IOM (Institute of Medicine). 2010. Dietary reference    intakes for calcium and D. [place]: The    [employer]. 2. Holick MF, Binkley [place], Bischoff-Ferrari HA, et al.    Evaluation, treatment, and prevention of vitamin D    deficiency: an Endocrine Society clinical practice    guideline. JCEM. 2011 Jul; 96(7):1911-30.          Passed - Ca in normal range and within 360 days    Calcium  Date Value Ref Range Status  06/27/2022 9.2 8.7 - 10.2 mg/dL Final   Calcium, Ion  Date Value Ref Range Status  01/27/2017 1.28 1.15 - 1.40 mmol/L Final         Passed - Valid encounter within last 12 months    Recent Outpatient Visits           3 months ago Annual physical exam   Primary Care at [facility], [place], NP   4 months ago Encounter to establish care   Primary Care at [facility], [place], NP   1 year ago Other hyperlipidemia   Primary Care at [facility], Laurita Quint, FNP   1 year ago Burning with urination   Primary Care at [place], Laurita Quint, FNP   2 years ago Left thigh pain   Primary Care at [place], MD       Future Appointments             In 2 weeks Camillia Herter, NP Primary Care at [facility]   In 1 month Wannetta Sender Governor Rooks, MD Urogynecology at [facility], [facility]

## 2022-10-17 NOTE — Progress Notes (Signed)
Erroneous encounter-disregard

## 2022-10-27 ENCOUNTER — Encounter: Payer: BC Managed Care – PPO | Admitting: Family

## 2022-10-27 DIAGNOSIS — I10 Essential (primary) hypertension: Secondary | ICD-10-CM

## 2022-10-27 DIAGNOSIS — E559 Vitamin D deficiency, unspecified: Secondary | ICD-10-CM

## 2022-11-14 ENCOUNTER — Other Ambulatory Visit: Payer: Self-pay | Admitting: Family

## 2022-11-14 DIAGNOSIS — E559 Vitamin D deficiency, unspecified: Secondary | ICD-10-CM

## 2022-11-14 DIAGNOSIS — E785 Hyperlipidemia, unspecified: Secondary | ICD-10-CM

## 2022-11-14 NOTE — Telephone Encounter (Signed)
Requested medication (s) are due for refill today:   Yes for rosuvastatin,  No for Drisdol  Requested medication (s) are on the active medication list:   Yes for rosuvastatin,  No for Drisdol  Future visit scheduled:   No  Had physical recently   Last ordered: Drisdol is not on the medication list.   Rosuvastatin 06/07/2022  90, 0 refills     Requested Prescriptions  Pending Prescriptions Disp Refills   Vitamin D, Ergocalciferol, (DRISDOL) 1.25 MG (50000 UNIT) CAPS capsule [Pharmacy Med Name: ergocalciferol (vitamin D2) 1,250 mcg (50,000 unit) capsule] 4 capsule 2    Sig: Take 1 capsule by mouth every 7 days for 12 doses.     Endocrinology:  Vitamins - Vitamin D Supplementation 2 Failed - 11/14/2022  2:49 PM      Failed - Manual Review: Route requests for 50,000 IU strength to the provider      Failed - Vitamin D in normal range and within 360 days    Vit D, 25-Hydroxy  Date Value Ref Range Status  06/27/2022 27.9 (L) 30.0 - 100.0 ng/mL Final    Comment:    Vitamin D deficiency has been defined by the Institute of Medicine and an Endocrine Society practice guideline as a level of serum 25-OH vitamin D less than 20 ng/mL (1,2). The Endocrine Society went on to further define vitamin D insufficiency as a level between 21 and 29 ng/mL (2). 1. IOM (Institute of Medicine). 2010. Dietary reference    intakes for calcium and D. [place]: The    [employer]. 2. Holick MF, Binkley [place], Bischoff-Ferrari HA, et al.    Evaluation, treatment, and prevention of vitamin D    deficiency: an Endocrine Society clinical practice    guideline. JCEM. 2011 Jul; 96(7):1911-30.          Passed - Ca in normal range and within 360 days    Calcium  Date Value Ref Range Status  06/27/2022 9.2 8.7 - 10.2 mg/dL Final   Calcium, Ion  Date Value Ref Range Status  01/27/2017 1.28 1.15 - 1.40 mmol/L Final         Passed - Valid encounter within last 12 months    Recent Outpatient  Visits           4 months ago Annual physical exam   Primary Care at [facility], Amy J, NP   5 months ago Encounter to establish care   Primary Care at [facility] Amy J, NP   1 year ago Other hyperlipidemia   Primary Care at [facility], Azalee Course, FNP   1 year ago Burning with urination   Primary Care at [facility] [place] [facility], Azalee Course, FNP   3 years ago Left thigh pain   Primary Care at [facility], Meda Coffee, MD       Future Appointments             In 2 weeks Marguerita Beards, MD Urogynecology at [facility], [facility]             rosuvastatin (CRESTOR) 10 MG tablet [Pharmacy Med Name: rosuvastatin 10 mg tablet] 90 tablet 0    Sig: TAKE 1 TABLET BY MOUTH EVERY DAY     Cardiovascular:  Antilipid - Statins 2 Failed - 11/14/2022  2:49 PM      Failed - Lipid Panel in normal range within the last 12 months    Cholesterol, Total  Date Value Ref Range Status  06/27/2022 181 100 - 199 mg/dL Final   LDL Chol Calc (NIH)  Date Value Ref Range Status  06/27/2022 87 0 - 99 mg/dL Final   HDL  Date Value Ref Range Status  06/27/2022 67 >39 mg/dL Final   Triglycerides  Date Value Ref Range Status  06/27/2022 157 (H) 0 - 149 mg/dL Final         Passed - Cr in normal range and within 360 days    Creatinine, Ser  Date Value Ref Range Status  06/27/2022 0.69 0.57 - 1.00 mg/dL Final         Passed - Patient is not pregnant      Passed - Valid encounter within last 12 months    Recent Outpatient Visits           4 months ago Annual physical exam   Primary Care at [facility], Amy J, NP   5 months ago Encounter to establish care   Primary Care at [facility], Amy J, NP   1 year ago Other hyperlipidemia   Primary Care at [place] [facility], Azalee Course, FNP   1 year ago Burning with urination   Primary Care at [place] [facility], Azalee Course, FNP   3 years ago Left thigh pain   Primary Care at [facility], Meda Coffee,  MD       Future Appointments             In 2 weeks Florian Buff Rochel Brome, MD Urogynecology at [facility], [facility] [place]

## 2022-12-01 ENCOUNTER — Encounter: Payer: Self-pay | Admitting: Obstetrics and Gynecology

## 2022-12-01 ENCOUNTER — Ambulatory Visit (INDEPENDENT_AMBULATORY_CARE_PROVIDER_SITE_OTHER): Payer: BC Managed Care – PPO | Admitting: Obstetrics and Gynecology

## 2022-12-01 VITALS — BP 176/98 | HR 72 | Ht 61.75 in | Wt 96.0 lb

## 2022-12-01 DIAGNOSIS — R350 Frequency of micturition: Secondary | ICD-10-CM | POA: Diagnosis not present

## 2022-12-01 DIAGNOSIS — K5904 Chronic idiopathic constipation: Secondary | ICD-10-CM | POA: Diagnosis not present

## 2022-12-01 DIAGNOSIS — N393 Stress incontinence (female) (male): Secondary | ICD-10-CM

## 2022-12-01 DIAGNOSIS — N3281 Overactive bladder: Secondary | ICD-10-CM

## 2022-12-01 LAB — POCT URINALYSIS DIPSTICK
Bilirubin, UA: NEGATIVE
Glucose, UA: NEGATIVE
Ketones, UA: NEGATIVE
Leukocytes, UA: NEGATIVE
Nitrite, UA: NEGATIVE
Protein, UA: NEGATIVE
Spec Grav, UA: 1.015 (ref 1.010–1.025)
Urobilinogen, UA: 0.2 E.U./dL
pH, UA: 5.5 (ref 5.0–8.0)

## 2022-12-01 MED ORDER — TROSPIUM CHLORIDE ER 60 MG PO CP24: 1.0000 | ORAL_CAPSULE | Freq: Every day | ORAL | 5 refills | Status: AC

## 2022-12-01 NOTE — Progress Notes (Signed)
[facility] New Patient Evaluation and Consultation  Referring Provider: Rema Fendt, NP PCP: Rema Fendt, NP Date of Service: 12/01/2022  SUBJECTIVE Chief Complaint: New Patient (Initial Visit)  History of Present Illness: Janice Singleton is a 56 y.o. Black or African-American female seen in consultation at the request of Dr. Zonia Kief for evaluation of urinary frequency.    Review of records significant for: Urinates every 20-25 minutes  Urinary Symptoms: Leaks urine with cough/ sneeze Leaks rarely Pad use: none  Day time voids 15.  Nocturia: 5 times per night to void. Frequency has been going on for about a year.  Voiding dysfunction: she does not empty her bladder well.  does not use a catheter to empty bladder.  When urinating, she feels the need to urinate multiple times in a row Drinks: [employer]- about 40oz per day, 3 pack of natural light, only drinks water sometimes, occasional tea Has been on oxybutynin 5mg  XL, and it does not work well for her. She has not tried other medications.   UTIs:  0  UTI's in the last year.   Denies history of blood in urine and kidney or bladder stones  Pelvic Organ Prolapse Symptoms:                  She Denies a feeling of a bulge the vaginal area.   Bowel Symptom: Bowel movements: 1-2 time(s) per week Stool consistency: hard, soft , or loose Straining: yes.  Splinting: no.  Incomplete evacuation: yes.  She Denies accidental bowel leakage / fecal incontinence Bowel regimen: none, occasionally will take a laxative (milk of magnesia) Last colonoscopy: Date 03/2018, Results polyps removed  Sexual Function Sexually active: no.   Pelvic Pain Denies pelvic pain  Past Medical History:  Past Medical History:  Diagnosis Date   Acid reflux    Constipation    Diabetes mellitus without complication (HCC)    Gestational    Hypertension    Pre-eclampsia      Past Surgical History:   Past Surgical History:   Procedure Laterality Date   CESAREAN SECTION       Past OB/GYN History: OB History  Gravida Para Term Preterm AB Living  1 1 1     1   SAB IAB Ectopic Multiple Live Births          1    # Outcome Date GA Lbr Len/2nd Weight Sex Delivery Anes PTL Lv  1 Term 06/30/83    F CS-LTranv   LIV    Menopausal: Yes, at age 42, Denies vaginal bleeding since menopause Last pap smear was 06/2022- negative.    Medications: She has a current medication list which includes the following prescription(s): amlodipine, clobetasol ointment, loratadine, rosuvastatin, triamcinolone ointment, trospium chloride, vitamin d (ergocalciferol), and lisinopril.   Allergies: Patient has no active allergies.   Social History:  Social History   Tobacco Use   Smoking status: Every Day    Packs/day: 2.00    Types: Cigarettes    Passive exposure: Current   Smokeless tobacco: Never  Vaping Use   Vaping Use: Never used  Substance Use Topics   Alcohol use: Yes    Alcohol/week: 21.0 standard drinks of alcohol    Types: 21 Cans of beer per week    Comment: 3 beers a day   Drug use: No    Relationship status: single She lives with daughter.   She is employed. Regular exercise: No History of abuse: No  Family History:   Family History  Problem Relation Age of Onset   Alcohol abuse Mother    Hypertension Father    Hypertension Sister    Hypertension Brother    Healthy Daughter    Breast cancer Neg Hx      Review of Systems: Review of Systems  Constitutional:  Negative for fever, malaise/fatigue and weight loss.  Respiratory:  Negative for cough, shortness of breath and wheezing.   Cardiovascular:  Negative for chest pain, palpitations and leg swelling.  Gastrointestinal:  Negative for abdominal pain and blood in stool.  Genitourinary:  Negative for dysuria.  Musculoskeletal:  Negative for myalgias.  Skin:  Negative for rash.  Neurological:  Negative for dizziness and headaches.   Endo/Heme/Allergies:  Does not bruise/bleed easily.  Psychiatric/Behavioral:  Negative for depression. The patient is not nervous/anxious.      OBJECTIVE Physical Exam: Vitals:   12/01/22 1256 12/01/22 1308  BP: (!) 177/107 (!) 176/98  Pulse: 74 72  Weight: 96 lb (43.5 kg)   Height: 5' 1.75" (1.568 m)     Physical Exam Constitutional:      General: She is not in acute distress. Pulmonary:     Effort: Pulmonary effort is normal.  Abdominal:     General: There is no distension.     Palpations: Abdomen is soft.     Tenderness: There is no abdominal tenderness. There is no rebound.  Musculoskeletal:        General: No swelling. Normal range of motion.  Skin:    General: Skin is warm and dry.     Findings: No rash.  Neurological:     Mental Status: She is alert and oriented to person, place, and time.  Psychiatric:        Mood and Affect: Mood normal.        Behavior: Behavior normal.      GU / Detailed Urogynecologic Evaluation:  Pelvic Exam: Normal external female genitalia; Bartholin's and Skene's glands normal in appearance; urethral meatus normal in appearance, no urethral masses or discharge.   CST: negative  Speculum exam reveals normal vaginal mucosa with atrophy. Cervix normal appearance. Uterus normal single, nontender. Adnexa no mass, fullness, tenderness.     Pelvic floor strength I/V  Pelvic floor musculature: Right levator non-tender, Right obturator non-tender, Left levator non-tender, Left obturator non-tender  POP-Q:   POP-Q  -3                                            Aa   -3                                           Ba  -7                                              C   2.5                                            Gh  3  Pb  7.5                                            tvl   -3                                            Ap  -3                                            Bp  -7.5                                               D      Rectal Exam:  Normal external rectum  Post-Void Residual (PVR) by Bladder Scan: In order to evaluate bladder emptying, we discussed obtaining a postvoid residual and she agreed to this procedure.  Procedure: The ultrasound unit was placed on the patient's abdomen in the suprapubic region after the patient had voided. A PVR of 19 ml was obtained by bladder scan.  Laboratory Results: POC urine: trace blood, otherwise negative   ASSESSMENT AND PLAN Ms. Nawaz is a 56 y.o. with:  1. Urinary frequency   2. Overactive bladder   3. SUI (stress urinary incontinence, female)   4. Chronic idiopathic constipation     - BP elevated today. She did not take her BP medication today.   OAB - We discussed the symptoms of overactive bladder (OAB), which include urinary urgency, urinary frequency, nocturia, with or without urge incontinence.  While we do not know the exact etiology of OAB, several treatment options exist. We discussed management including behavioral therapy (decreasing bladder irritants, urge suppression strategies, timed voids, bladder retraining), physical therapy, medication; for refractory cases posterior tibial nerve stimulation, sacral neuromodulation, and intravesical botulinum toxin injection.  - Prescribed trospium 60mg  ER daily. Stop oxybutynin. For anticholinergic medications, we discussed the potential side effects of anticholinergics including dry eyes, dry mouth, constipation, cognitive impairment and urinary retention. - She does feel she can cut back on the soda and beer and drink more water.   2. SUI - rare, will defer treatment  3. Constipation - For constipation, we reviewed the importance of a better bowel regimen.  We discussed initiating therapy with increasing fluid intake, fiber supplementation, stool softeners, and laxatives such as miralax.  -Recommended starting with 1 capful miralax daily and titrating as  needed  Return 6 weeks  , MD   Medical Decision Making:  - Reviewed/ ordered a clinical laboratory test - Review and summation of prior records

## 2022-12-01 NOTE — Patient Instructions (Addendum)
Today we talked about ways to manage bladder urgency such as altering your diet to avoid irritative beverages and foods (bladder diet) as well as attempting to decrease stress and other exacerbating factors.  Stop drinking prior to bedtime.   The Most Bothersome Foods* The Least Bothersome Foods*  Coffee - Regular & Decaf Tea - caffeinated Carbonated beverages - cola, non-colas, diet & caffeine-free Alcohols - Beer, Red Wine, White Wine, [address] - Grapefruit, [place], Orange, [employer] - Cranberry, Grapefruit, Orange, Pineapple Vegetables - Tomato & Tomato Products Flavor Enhancers - Hot peppers, Spicy foods, Chili, Horseradish, Vinegar, Monosodium glutamate (MSG) Artificial Sweeteners - NutraSweet, Sweet 'N Low, Equal (sweetener), Saccharin Ethnic foods - [place], [place], [place] food [employer] - low-fat & whole Fruits - Bananas, Blueberries, Honeydew melon, Pears, Raisins, Watermelon Vegetables - Broccoli, [address] Sprouts, [place], Carrots, Cauliflower, [place], Cucumber, Mushrooms, Peas, Radishes, Squash, Zucchini, White potatoes, Sweet potatoes & yams Poultry - Chicken, Eggs, [place], [employer] - Beef, Diplomatic Services operational officer, Lamb Seafood - Shrimp, [place] fish, Salmon Grains - Oat, Rice Snacks - Pretzels, Popcorn  *Lenward Chancellor et al. Diet and its role in interstitial cystitis/bladder pain syndrome (IC/BPS) and comorbid conditions. [employer]. BJU Int. 2012 Jan 11.   Constipation: Our goal is to achieve formed bowel movements daily or every-other-day.  You may need to try different combinations of the following options to find what works best for you - everybody's body works differently so feel free to adjust the dosages as needed.  Some options to help maintain bowel health include:  Dietary changes (more leafy greens, vegetables and fruits; less processed foods) Fiber supplementation (Benefiber, FiberCon, Metamucil or Psyllium). Start slow and increase gradually to full  dose. Over-the-counter agents such as: stool softeners (Docusate or Colace) and/or laxatives (Miralax, milk of magnesia). Start with miralax 1 capful in a glass of water daily and titrate as needed.  "Power Pudding" is a natural mixture that may help your constipation.  To make blend 1 cup applesauce, 1 cup wheat bran, and 3/4 cup prune juice, refrigerate and then take 1 tablespoon daily with a large glass of water as needed.

## 2023-01-09 ENCOUNTER — Other Ambulatory Visit: Payer: Self-pay | Admitting: Family

## 2023-01-09 DIAGNOSIS — E559 Vitamin D deficiency, unspecified: Secondary | ICD-10-CM

## 2023-01-15 ENCOUNTER — Ambulatory Visit: Payer: BC Managed Care – PPO | Admitting: Obstetrics and Gynecology

## 2023-01-15 NOTE — Progress Notes (Deleted)
[facility] Return Visit  SUBJECTIVE  History of Present Illness: Janice Singleton is a 57 y.o. female seen in follow-up for OAB. Plan at last visit was start Trospium '60mg'$  ER daily.     Past Medical History: Patient  has a past medical history of Acid reflux, Constipation, Diabetes mellitus without complication ([place]), Hypertension, and Pre-eclampsia.   Past Surgical History: She  has a past surgical history that includes Cesarean section.   Medications: She has a current medication list which includes the following prescription(s): amlodipine, clobetasol ointment, lisinopril, loratadine, rosuvastatin, triamcinolone ointment, trospium chloride, and vitamin d (ergocalciferol).   Allergies: Patient has no active allergies.   Social History: Patient  reports that she has been smoking cigarettes. She has been smoking an average of 2 packs per day. She has been exposed to tobacco smoke. She has never used smokeless tobacco. She reports current alcohol use of about 21.0 standard drinks of alcohol per week. She reports that she does not use drugs.      OBJECTIVE     Physical Exam: There were no vitals filed for this visit. Gen: No apparent distress, A&O x 3.  Detailed Urogynecologic Evaluation:  Deferred. Prior exam showed:      No data to display             ASSESSMENT AND PLAN    Janice Singleton is a 57 y.o. with:  No diagnosis found.

## 2023-01-29 ENCOUNTER — Encounter: Payer: Self-pay | Admitting: *Deleted

## 2023-06-26 ENCOUNTER — Other Ambulatory Visit: Payer: Self-pay | Admitting: Family

## 2023-06-26 DIAGNOSIS — E785 Hyperlipidemia, unspecified: Secondary | ICD-10-CM

## 2024-01-11 DIAGNOSIS — M791 Myalgia, unspecified site: Secondary | ICD-10-CM | POA: Diagnosis not present

## 2024-01-11 DIAGNOSIS — I10 Essential (primary) hypertension: Secondary | ICD-10-CM | POA: Diagnosis not present

## 2024-01-11 DIAGNOSIS — R300 Dysuria: Secondary | ICD-10-CM | POA: Diagnosis not present

## 2024-03-18 DIAGNOSIS — I10 Essential (primary) hypertension: Secondary | ICD-10-CM | POA: Diagnosis not present

## 2024-03-18 DIAGNOSIS — M79605 Pain in left leg: Secondary | ICD-10-CM | POA: Diagnosis not present

## 2024-03-18 DIAGNOSIS — M79604 Pain in right leg: Secondary | ICD-10-CM | POA: Diagnosis not present

## 2024-03-18 DIAGNOSIS — R053 Chronic cough: Secondary | ICD-10-CM | POA: Diagnosis not present

## 2024-03-19 DIAGNOSIS — I160 Hypertensive urgency: Secondary | ICD-10-CM | POA: Diagnosis not present

## 2024-03-19 DIAGNOSIS — I1A0 Resistant hypertension: Secondary | ICD-10-CM | POA: Diagnosis not present

## 2024-03-19 DIAGNOSIS — R053 Chronic cough: Secondary | ICD-10-CM | POA: Diagnosis not present

## 2024-03-19 DIAGNOSIS — F17200 Nicotine dependence, unspecified, uncomplicated: Secondary | ICD-10-CM | POA: Diagnosis not present

## 2024-03-21 DIAGNOSIS — Z1329 Encounter for screening for other suspected endocrine disorder: Secondary | ICD-10-CM | POA: Diagnosis not present

## 2024-03-21 DIAGNOSIS — Z1321 Encounter for screening for nutritional disorder: Secondary | ICD-10-CM | POA: Diagnosis not present

## 2024-03-21 DIAGNOSIS — Z79899 Other long term (current) drug therapy: Secondary | ICD-10-CM | POA: Diagnosis not present

## 2024-03-21 DIAGNOSIS — Z130 Encounter for screening for diseases of the blood and blood-forming organs and certain disorders involving the immune mechanism: Secondary | ICD-10-CM | POA: Diagnosis not present

## 2024-03-21 DIAGNOSIS — R053 Chronic cough: Secondary | ICD-10-CM | POA: Diagnosis not present

## 2024-03-21 DIAGNOSIS — E559 Vitamin D deficiency, unspecified: Secondary | ICD-10-CM | POA: Diagnosis not present

## 2024-03-21 DIAGNOSIS — Z113 Encounter for screening for infections with a predominantly sexual mode of transmission: Secondary | ICD-10-CM | POA: Diagnosis not present

## 2024-03-21 DIAGNOSIS — Z13228 Encounter for screening for other metabolic disorders: Secondary | ICD-10-CM | POA: Diagnosis not present

## 2024-03-28 DIAGNOSIS — I10 Essential (primary) hypertension: Secondary | ICD-10-CM | POA: Diagnosis not present

## 2024-04-16 DIAGNOSIS — M25562 Pain in left knee: Secondary | ICD-10-CM | POA: Diagnosis not present

## 2024-04-16 DIAGNOSIS — M25561 Pain in right knee: Secondary | ICD-10-CM | POA: Diagnosis not present

## 2024-04-16 DIAGNOSIS — F17210 Nicotine dependence, cigarettes, uncomplicated: Secondary | ICD-10-CM | POA: Diagnosis not present

## 2024-06-06 ENCOUNTER — Ambulatory Visit: Payer: Self-pay

## 2024-06-06 NOTE — Telephone Encounter (Signed)
 FYI Only or Action Required?: FYI only for provider.  Patient was last seen in primary care on 06/27/2022 by Lorren Greig PARAS, NP. Called Nurse Triage reporting Leg Pain. Symptoms began several years ago. Interventions attempted: Nothing. Symptoms are: stable.  Triage Disposition: No disposition on file. See note   Patient/caregiver understands and will follow disposition?: yes     Copied from CRM [id number]. Topic: Clinical - Red Word Triage >> Jun 06, 2024  4:42 PM Mercer PEDLAR wrote: Red Word that prompted transfer to Nurse Triage: Severe leg pain, both legs locking up. Believes she has a bruised pelvis.    ----------------------------------------------------------------------- From previous Reason for Contact - Scheduling: Patient/patient representative is calling to schedule an appointment. Refer to attachments for appointment information. Answer Assessment - Initial Assessment Questions 1. ONSET: When did the pain start?      ---------- Couple Years    2. LOCATION: Where is the pain located?      ----- Bilateral thighs     3. PAIN: How bad is the pain?    (Scale 1-10; or mild, moderate, severe)   -  MILD (1-3): doesn't interfere with normal activities    -  MODERATE (4-7): interferes with normal activities (e.g., work or school) or awakens from sleep, limping    -  SEVERE (8-10): excruciating pain, unable to do any normal activities, unable to walk       ----------- 10/10- When it does happen ( Thigh area )    4. WORK OR EXERCISE: Has there been any recent work or exercise that involved this part of the body?      -------------N/A    5. CAUSE: What do you think is causing the leg pain?     ------------------- Dx: Bruised pelvis.     6. OTHER SYMPTOMS: Do you have any other symptoms? (e.g., chest pain, back pain, breathing difficulty, swelling, rash, fever, numbness, weakness)     --------------------   Additional Information:  Patient reports she has  feeling her thighs locking up.  Confirms this is a chronic issue, but relays it has been happening my frequently lately.  Denies s/s during the call, as they are intermittent.    Appointment scheduled for patient appropriately.  Patient educated on pertinent s/s that would warrant emergent help/call 911/ ED/UC.  Patient verbalized understanding and agrees with plan No additional questions/concerns noted during the time of the call.  Protocols used: Leg Pain-A-AH

## 2024-06-10 ENCOUNTER — Ambulatory Visit: Payer: Self-pay | Attending: Family Medicine | Admitting: Family Medicine

## 2024-06-10 ENCOUNTER — Encounter: Payer: Self-pay | Admitting: Family Medicine

## 2024-06-10 VITALS — BP 146/79 | HR 74 | Ht 61.75 in | Wt 102.8 lb

## 2024-06-10 DIAGNOSIS — M79652 Pain in left thigh: Secondary | ICD-10-CM | POA: Diagnosis not present

## 2024-06-10 DIAGNOSIS — I10 Essential (primary) hypertension: Secondary | ICD-10-CM | POA: Diagnosis not present

## 2024-06-10 DIAGNOSIS — E7849 Other hyperlipidemia: Secondary | ICD-10-CM | POA: Diagnosis not present

## 2024-06-10 DIAGNOSIS — M79651 Pain in right thigh: Secondary | ICD-10-CM | POA: Diagnosis not present

## 2024-06-10 MED ORDER — MELOXICAM 7.5 MG PO TABS: 7.5000 mg | ORAL_TABLET | Freq: Every day | ORAL | 1 refills | Status: AC

## 2024-06-10 NOTE — Progress Notes (Signed)
 Subjective:  Patient ID: Janice Singleton, female    DOB: 1966-12-09  Age: 58 y.o. MRN: [id number]  CC: Pain (Bilateral thigh pain)     Discussed the use of AI scribe software for clinical note transcription with the patient, who gave verbal consent to proceed.  History of Present Illness Janice Singleton is a 58 year old female patient with a history of hypertension, hyperlipidemia who presents with thigh pain and a locking sensation.  She experiences deep pain in her thighs for about a year, primarily during walking or standing, causing a locking sensation that necessitates stopping. The pain occurs in both thighs without numbness, heaviness, or radiation. There is no shock-like sensation or radiation of pain down her legs. The pain is absent while sitting and does not worsen with rosuvastatin  use. She occasionally uses BC powder for relief. She recalls a bruised pelvis a couple of years ago. Her work involves standing for about eight hours, which exacerbates the pain.  She was last seen by [facility] 2 months ago and had labs done which were unrevealing.  She did have knee x-rays but no femur x-rays. Right femur x-ray in 2020 revealed mild spurring in the right hip joint, no bony abnormality.  MRI of right hip joint in 2020 revealed stress fracture of the right pubic acetabular junction.  Past Medical History:  Diagnosis Date   Acid reflux    Constipation    Diabetes mellitus without complication (HCC)    Gestational    Hypertension    Pre-eclampsia     Past Surgical History:  Procedure Laterality Date   CESAREAN SECTION      Family History  Problem Relation Age of Onset   Alcohol abuse Mother    Hypertension Father    Hypertension Sister    Hypertension Brother    Healthy Daughter    Breast cancer Neg Hx     Social History   Socioeconomic History   Marital status: Single    Spouse name: Not on file   Number of children: Not on file   Years of education: Not on  file   Highest education level: Not on file  Occupational History   Occupation: Physicist, medical  Tobacco Use   Smoking status: Every Day    Current packs/day: 2.00    Types: Cigarettes    Passive exposure: Current   Smokeless tobacco: Never  Vaping Use   Vaping status: Never Used  Substance and Sexual Activity   Alcohol use: Yes    Alcohol/week: 21.0 standard drinks of alcohol    Types: 21 Cans of beer per week    Comment: 3 beers a day   Drug use: No   Sexual activity: Not Currently    Birth control/protection: None  Other Topics Concern   Not on file  Social History Narrative   Patient alone lives in [place].   Social Drivers of Corporate investment banker Strain: Not on file  Food Insecurity: Low Risk  (03/19/2024)   Received from [facility]   Hunger Vital Sign    Within the past 12 months, you worried that your food would run out before you got money to buy more: Never true    Within the past 12 months, the food you bought just didn't last and you didn't have money to get more. : Never true  Transportation Needs: Unmet Transportation Needs (03/19/2024)   Received from [facility]    In the past  12 months, has lack of reliable transportation kept you from medical appointments, meetings, work or from getting things needed for daily living? : Yes  Physical Activity: Not on file  Stress: Not on file  Social Connections: Not on file    No Known Allergies  Outpatient Medications Prior to Visit  Medication Sig Dispense Refill   amLODipine  (NORVASC ) 10 MG tablet TAKE 1 TABLET BY MOUTH EVERY DAY 90 tablet 1   clobetasol  ointment (TEMOVATE ) 0.05 % Apply 1 Application topically 2 (two) times daily. 60 g 1   lisinopril  (ZESTRIL ) 30 MG tablet Take 1 tablet (30 mg total) by mouth daily. 30 tablet 2   loratadine  (CLARITIN ) 10 MG tablet Take 1 tablet (10 mg total) by mouth daily. 30 tablet 11   rosuvastatin  (CRESTOR ) 10 MG tablet TAKE 1 TABLET BY MOUTH EVERY  DAY 90 tablet 0   triamcinolone  ointment (KENALOG ) 0.5 % Apply 1 Application topically 2 (two) times daily. 30 g 3   Vitamin D , Ergocalciferol , (DRISDOL ) 1.25 MG (50000 UNIT) CAPS capsule Take 1 capsule by mouth every 7 days for 12 doses. 4 capsule 2   Trospium  Chloride 60 MG CP24 Take 1 capsule (60 mg total) by mouth daily. (Patient not taking: Reported on 06/10/2024) 30 capsule 5   No facility-administered medications prior to visit.     ROS Review of Systems  Constitutional:  Negative for activity change and appetite change.  HENT:  Negative for sinus pressure and sore throat.   Respiratory:  Negative for chest tightness, shortness of breath and wheezing.   Cardiovascular:  Negative for chest pain and palpitations.  Gastrointestinal:  Negative for abdominal distention, abdominal pain and constipation.  Genitourinary: Negative.   Musculoskeletal:        See HPI  Psychiatric/Behavioral:  Negative for behavioral problems and dysphoric mood.     Objective:  BP (!) 146/77   Pulse 74   Ht 5' 1.75 (1.568 m)   Wt 102 lb 12.8 oz (46.6 kg)   LMP 01/26/2016 (Approximate)   SpO2 97%   BMI 18.95 kg/m      06/10/2024    3:20 PM 12/01/2022    1:08 PM 12/01/2022   12:56 PM  BP/Weight  Systolic BP 146 176 177  Diastolic BP 77 98 107  Wt. (Lbs) 102.8  96  BMI 18.95 kg/m2  17.7 kg/m2      Physical Exam Constitutional:      Appearance: She is well-developed.   Cardiovascular:     Rate and Rhythm: Normal rate.     Heart sounds: Normal heart sounds. No murmur heard. Pulmonary:     Effort: Pulmonary effort is normal.     Breath sounds: Normal breath sounds. No wheezing or rales.  Chest:     Chest wall: No tenderness.  Abdominal:     General: Bowel sounds are normal. There is no distension.     Palpations: Abdomen is soft. There is no mass.     Tenderness: There is no abdominal tenderness.   Musculoskeletal:     Right lower leg: No edema.     Left lower leg: No edema.      Comments: No tenderness on palpation of bilateral femur Negative straight leg raise bilaterally   Neurological:     Mental Status: She is alert and oriented to person, place, and time.   Psychiatric:        Mood and Affect: Mood normal.        Latest Ref Rng &  Units 06/27/2022    3:03 PM 06/07/2022    2:54 PM 12/09/2020    3:20 PM  CMP  Glucose 70 - 99 mg/dL 898  877  96   BUN 6 - 24 mg/dL 10  13  12    Creatinine 0.57 - 1.00 mg/dL 9.30  9.32  9.23   Sodium 134 - 144 mmol/L 143  143  141   Potassium 3.5 - 5.2 mmol/L 4.3  3.8  4.8   Chloride 96 - 106 mmol/L 108  109  105   CO2 20 - 29 mmol/L 20  20  21    Calcium  8.7 - 10.2 mg/dL 9.2  9.3  9.0   Total Protein 6.0 - 8.5 g/dL 6.9   6.2   Total Bilirubin 0.0 - 1.2 mg/dL 0.2   0.6   Alkaline Phos 44 - 121 IU/L 100   104   AST 0 - 40 IU/L 18   18   ALT 0 - 32 IU/L 14   14     Lipid Panel     Component Value Date/Time   CHOL 181 06/27/2022 1503   TRIG 157 (H) 06/27/2022 1503   HDL 67 06/27/2022 1503   CHOLHDL 2.7 06/27/2022 1503   LDLCALC 87 06/27/2022 1503    CBC    Component Value Date/Time   WBC 7.4 06/27/2022 1503   WBC 8.8 02/06/2018 1435   RBC 4.90 06/27/2022 1503   RBC 4.24 02/06/2018 1435   HGB 15.8 06/27/2022 1503   HCT 46.9 (H) 06/27/2022 1503   PLT 252 06/27/2022 1503   MCV 96 06/27/2022 1503   MCH 32.2 06/27/2022 1503   MCH 33.3 02/06/2018 1435   MCHC 33.7 06/27/2022 1503   MCHC 34.3 02/06/2018 1435   RDW 12.9 06/27/2022 1503   LYMPHSABS 2.6 12/09/2020 1520   MONOABS 0.3 02/06/2018 1435   EOSABS 0.1 12/09/2020 1520   BASOSABS 0.1 12/09/2020 1520    Lab Results  Component Value Date   HGBA1C 5.5 06/07/2022       Assessment & Plan Chronic thigh pain Chronic bilateral thigh pain for one year, worsened by standing or walking. Possible musculoskeletal or bone pathology. Previous knee x-rays negative. - Order x-rays of left and right femur to assess for bone pathology. - Prescribe meloxicam  once  daily for pain management, monitor kidney function. -She is also on Crestor  for hyperlipidemia but has not noticed any difference in slight pain when she does not take Crestor  - Review kidney function tests for meloxicam  safety. - Send x-ray results to MyChart.  Hypertension Slightly above goal Hypertension managed with antihypertensives Continue to follow-up with PCP for regimen adjustment  Hyperlipidemia On rosuvastatin  for cholesterol management. No link to thigh pain.  Follow-up Advised to follow up on test results and medication efficacy. - Check MyChart for x-ray results.    No orders of the defined types were placed in this encounter.   Follow-up: No follow-ups on file.       Corrina Sabin, MD, FAAFP. [facility] [place], [place] [phone]   06/10/2024, 3:32 PM

## 2024-06-10 NOTE — Patient Instructions (Signed)
 VISIT SUMMARY:  Today, you were seen for chronic thigh pain that you've been experiencing for about a year, primarily when walking or standing. We discussed your symptoms and reviewed your medical history, including your hypertension and hyperlipidemia management.  YOUR PLAN:  -CHRONIC THIGH PAIN: Chronic thigh pain means you have been experiencing pain in your thighs for a long period. This pain worsens when you stand or walk and may be due to musculoskeletal or bone issues. We will order x-rays of both your left and right femur to check for any bone problems. You will start taking meloxicam  once daily to help manage the pain, and we will monitor your kidney function to ensure the medication is safe for you. Please check MyChart for your x-ray results.  -HYPERTENSION: Hypertension is high blood pressure, which you are currently managing with medication. Continue taking your medication as prescribed.  -HYPERLIPIDEMIA: Hyperlipidemia means you have high cholesterol levels, which you are managing with rosuvastatin . There is no link between your cholesterol medication and your thigh pain.  INSTRUCTIONS:  Please follow up on your test results and the effectiveness of your new medication. Check MyChart for your x-ray results.

## 2024-06-11 ENCOUNTER — Ambulatory Visit: Payer: Self-pay | Admitting: Family Medicine

## 2024-06-11 DIAGNOSIS — M79651 Pain in right thigh: Secondary | ICD-10-CM

## 2024-06-11 LAB — CBC WITH DIFFERENTIAL/PLATELET
Basophils Absolute: 0 10*3/uL (ref 0.0–0.2)
Basos: 1 %
EOS (ABSOLUTE): 0.1 10*3/uL (ref 0.0–0.4)
Eos: 2 %
Hematocrit: 38.9 % (ref 34.0–46.6)
Hemoglobin: 13 g/dL (ref 11.1–15.9)
Immature Grans (Abs): 0 10*3/uL (ref 0.0–0.1)
Immature Granulocytes: 0 %
Lymphocytes Absolute: 2.7 10*3/uL (ref 0.7–3.1)
Lymphs: 40 %
MCH: 31.6 pg (ref 26.6–33.0)
MCHC: 33.4 g/dL (ref 31.5–35.7)
MCV: 94 fL (ref 79–97)
Monocytes Absolute: 0.5 10*3/uL (ref 0.1–0.9)
Monocytes: 7 %
Neutrophils Absolute: 3.5 10*3/uL (ref 1.4–7.0)
Neutrophils: 50 %
Platelets: 255 10*3/uL (ref 150–450)
RBC: 4.12 x10E6/uL (ref 3.77–5.28)
RDW: 13 % (ref 11.7–15.4)
WBC: 6.9 10*3/uL (ref 3.4–10.8)

## 2024-06-11 LAB — CMP14+EGFR
ALT: 18 IU/L (ref 0–32)
AST: 17 IU/L (ref 0–40)
Albumin: 4 g/dL (ref 3.8–4.9)
Alkaline Phosphatase: 98 IU/L (ref 44–121)
BUN/Creatinine Ratio: 17 (ref 12–28)
BUN: 11 mg/dL (ref 8–27)
Bilirubin Total: 0.5 mg/dL (ref 0.0–1.2)
CO2: 19 mmol/L — ABNORMAL LOW (ref 20–29)
Calcium: 9.5 mg/dL (ref 8.7–10.3)
Chloride: 105 mmol/L (ref 96–106)
Creatinine, Ser: 0.64 mg/dL (ref 0.57–1.00)
Globulin, Total: 2.8 g/dL (ref 1.5–4.5)
Glucose: 95 mg/dL (ref 70–99)
Potassium: 4.2 mmol/L (ref 3.5–5.2)
Sodium: 142 mmol/L (ref 134–144)
Total Protein: 6.8 g/dL (ref 6.0–8.5)
eGFR: 101 mL/min/{1.73_m2} (ref >59)

## 2024-06-11 LAB — SEDIMENTATION RATE: Sed Rate: 34 mm/h (ref 0–40)

## 2024-06-11 LAB — C-REACTIVE PROTEIN: CRP: 3 mg/L (ref 0–10)

## 2024-06-25 ENCOUNTER — Ambulatory Visit
Admission: RE | Admit: 2024-06-25 | Discharge: 2024-06-25 | Disposition: A | Discharge status: Home / Self Care | Source: Ambulatory Visit | Attending: Family Medicine | Admitting: Family Medicine

## 2024-06-25 DIAGNOSIS — M79652 Pain in left thigh: Secondary | ICD-10-CM | POA: Diagnosis not present

## 2024-06-25 DIAGNOSIS — M79651 Pain in right thigh: Secondary | ICD-10-CM | POA: Diagnosis not present

## 2024-07-02 NOTE — Telephone Encounter (Signed)
 Please clarify patient's question/concern. Thank you.

## 2024-07-21 ENCOUNTER — Ambulatory Visit: Admitting: Sports Medicine

## 2024-10-13 ENCOUNTER — Encounter: Payer: Self-pay | Admitting: Radiology
